# Patient Record
Sex: Female | Born: 1985 | Race: Black or African American | Marital: Single | Smoking: Former smoker
Health system: Northeastern US, Academic
[De-identification: ages and names within clinical notes are randomized; demographics above are authoritative.]

## PROBLEM LIST (undated history)

## (undated) DIAGNOSIS — IMO0002 Reserved for concepts with insufficient information to code with codable children: Secondary | ICD-10-CM

## (undated) DIAGNOSIS — D649 Anemia, unspecified: Secondary | ICD-10-CM

## (undated) DIAGNOSIS — N926 Irregular menstruation, unspecified: Secondary | ICD-10-CM

## (undated) DIAGNOSIS — R87619 Unspecified abnormal cytological findings in specimens from cervix uteri: Secondary | ICD-10-CM

## (undated) HISTORY — PX: PELVIC LAPAROSCOPY: SHX162

## (undated) HISTORY — DX: Anemia, unspecified: D64.9

## (undated) HISTORY — DX: Unspecified abnormal cytological findings in specimens from cervix uteri: R87.619

## (undated) HISTORY — PX: OVARY REMOVAL: SHX86

## (undated) HISTORY — DX: Reserved for concepts with insufficient information to code with codable children: IMO0002

---

## 2007-03-31 HISTORY — PX: ECTOPIC PREGNANCY SURGERY: SHX613

## 2009-06-23 ENCOUNTER — Encounter: Payer: Self-pay | Admitting: Emergency Medicine

## 2009-06-23 ENCOUNTER — Emergency Department
Admission: EM | Admit: 2009-06-23 | Disposition: A | Discharge status: Home / Self Care | Payer: Self-pay | Source: Ambulatory Visit | Attending: Emergency Medicine | Admitting: Emergency Medicine

## 2009-06-23 LAB — TYPE AND SCREEN
ABO RH Blood Type: O POS
Antibody Screen: NEGATIVE

## 2009-06-23 LAB — PLASMA PROF 7 (ED ONLY)
Anion Gap,PL: 12 (ref 7–16)
CO2,Plasma: 23 mmol/L (ref 20–28)
Chloride,Plasma: 104 mmol/L (ref 96–108)
Creatinine: 0.74 mg/dL (ref 0.51–0.95)
GFR,Black: >59 *
GFR,Caucasian: >59 *
Glucose,Plasma: 92 mg/dL (ref 74–106)
Potassium,Plasma: 3.7 mmol/L (ref 3.4–4.7)
Sodium,Plasma: 139 mmol/L (ref 132–146)
UN,Plasma: 9 mg/dL (ref 6–20)

## 2009-06-23 LAB — COMPREHENSIVE METABOLIC PANEL
ALT: 25 U/L (ref 0–35)
AST: 57 U/L — ABNORMAL HIGH (ref 0–35)
Albumin: 4.8 g/dL (ref 3.5–5.2)
Alk Phos: 81 U/L (ref 35–105)
Anion Gap: 9 (ref 7–16)
Bilirubin,Total: 0.2 mg/dL (ref 0.0–1.2)
CO2: 25 mmol/L (ref 20–28)
Calcium: 8.9 mg/dL — ABNORMAL LOW (ref 9.0–10.4)
Chloride: 104 mmol/L (ref 96–108)
Creatinine: 0.75 mg/dL (ref 0.51–0.95)
GFR,Black: >59 *
GFR,Caucasian: >59 *
Glucose: 94 mg/dL (ref 74–106)
Lab: 10 mg/dL (ref 6–20)
Potassium: 3.7 mmol/L (ref 3.3–5.1)
Sodium: 138 mmol/L (ref 133–145)
Total Protein: 7.6 g/dL (ref 6.3–7.7)

## 2009-06-23 LAB — HOLD RED: Hold Red: 1

## 2009-06-23 LAB — ETHANOL: Ethanol: 209 mg/dL

## 2009-06-23 LAB — HOLD GRAY

## 2009-06-23 LAB — POCT URINE PREGNANCY
Exp date: 22912
Lot #: 706935
Preg Test,UR POC: NEGATIVE

## 2009-06-23 LAB — CBC
Hematocrit: 36 % (ref 34–45)
Hemoglobin: 13.2 g/dL (ref 11.2–15.7)
MCV: 78 fL — ABNORMAL LOW (ref 79–95)
Platelets: 254 THOU/uL (ref 160–370)
RBC: 4.7 MIL/uL (ref 3.9–5.2)
RDW: 13.2 % (ref 11.7–14.4)
WBC: 9.3 THOU/uL (ref 4.0–10.0)

## 2009-06-23 LAB — APTT: aPTT: 28.9 s (ref 22.3–35.3)

## 2009-06-23 LAB — PROTIME-INR
INR: 1 (ref 0.9–1.1)
Protime: 13.4 s (ref 11.9–14.7)

## 2009-06-23 LAB — RUQ PANEL (ED ONLY)
Amylase: 78 U/L (ref 28–100)
Bilirubin,Direct: <0.2 mg/dL (ref 0.0–0.3)
Lipase: 23 U/L (ref 13–60)

## 2009-06-23 MED ORDER — HYDROCODONE-ACETAMINOPHEN 5-500 MG PO TABS
1.0000 | ORAL_TABLET | Freq: Once | ORAL | Status: AC
Start: 2009-06-23 — End: 2009-06-23

## 2009-06-23 MED ORDER — HYDROCODONE-ACETAMINOPHEN 5-500 MG PO TABS
ORAL_TABLET | ORAL | Status: AC
Start: 2009-06-23 — End: 2009-06-23
  Filled 2009-06-23: qty 2

## 2009-06-23 MED ORDER — HYDROCODONE-ACETAMINOPHEN 5-500 MG PO TABS
1.0000 | ORAL_TABLET | Freq: Four times a day (QID) | ORAL | Status: AC | PRN
Start: 2009-06-23 — End: 2009-07-03

## 2009-06-23 MED ORDER — MORPHINE SULFATE 4 MG/ML IJ SOLN
4.0000 mg | Freq: Once | INTRAMUSCULAR | Status: DC
Start: 2009-06-23 — End: 2009-06-23
  Filled 2009-06-23: qty 1

## 2009-06-23 MED ORDER — HYDROCODONE-ACETAMINOPHEN 5-500 MG PO TABS
1.0000 | ORAL_TABLET | Freq: Once | ORAL | Status: DC
Start: 2009-06-23 — End: 2009-06-23

## 2009-06-23 MED ORDER — MORPHINE SULFATE 4 MG/ML IJ SOLN
4.0000 mg | Freq: Once | INTRAMUSCULAR | Status: AC
Start: 2009-06-23 — End: 2009-06-23
  Filled 2009-06-23: qty 1

## 2009-06-23 MED ORDER — SODIUM CHLORIDE 0.9 % IV BOLUS *I*
2000.0000 mL | Freq: Once | Status: AC
Start: 2009-06-23 — End: 2009-06-23

## 2009-06-23 MED ORDER — ONDANSETRON HCL 2 MG/ML IV SOLN *I*
4.0000 mg | Freq: Once | INTRAMUSCULAR | Status: AC
Start: 2009-06-23 — End: 2009-06-23
  Filled 2009-06-23: qty 2

## 2009-06-23 MED ORDER — TETANUS-DIPHTHERIA TOXOIDS TD 5-2 LFU IM INJ
0.5000 mL | INJECTION | Freq: Once | INTRAMUSCULAR | Status: DC
Start: 2009-06-23 — End: 2009-06-23

## 2009-06-23 MED ADMIN — Hydrocodone-Acetaminophen Tab 5-500 MG: 1 | ORAL | NDC 00406035762

## 2009-06-23 MED ADMIN — Sodium Chloride IV Soln 0.9%: 2000 mL | INTRAVENOUS | NDC 00338004904

## 2009-06-23 MED ADMIN — Ondansetron HCl Inj 4 MG/2ML (2 MG/ML): 4 mg | INTRAVENOUS | NDC 00409475503

## 2009-06-23 MED ADMIN — Morphine Sulfate Inj 4 MG/ML: 4 mg | INTRAVENOUS | NDC 00409125830
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
## 2009-06-23 NOTE — Progress Notes (Signed)
Social Work provided support to patient's family.  Social Work facilitated communication between physician and family. Social Work assisted family in seeing patient.      [address] [place], [place] [phone]
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
## 2009-06-24 ENCOUNTER — Encounter: Payer: Self-pay | Admitting: Emergency Medicine

## 2009-06-24 ENCOUNTER — Emergency Department
Admission: EM | Admit: 2009-06-24 | Disposition: A | Discharge status: Home / Self Care | Payer: Self-pay | Source: Ambulatory Visit

## 2009-06-24 MED ORDER — CYCLOBENZAPRINE HCL 10 MG PO TABS *I*
10.0000 mg | ORAL_TABLET | Freq: Three times a day (TID) | ORAL | Status: AC | PRN
Start: 2009-06-24 — End: 2009-07-04
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
## 2009-06-24 NOTE — Discharge Instructions (Signed)
Take Vicodin 1-2 tablets every 4-6 hours as needed for pain control.  Do not take Tylenol.  Do not drive while taking.    Take Flexeril 1 tablet every eight hours as needed for spasm and pain control.    Apply heat to the affected area.    Wear neck collar until you follow up with orthopedics.    Follow up with your primary care provider.    Return to the ED with any worsening or concerning signs or symptoms.
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
## 2010-03-28 ENCOUNTER — Ambulatory Visit
Admit: 2010-03-28 | Discharge: 2010-03-28 | Disposition: A | Discharge status: Home / Self Care | Payer: Self-pay | Source: Ambulatory Visit

## 2010-03-28 LAB — CBC AND DIFFERENTIAL
Baso # K/uL: 0 THOU/uL (ref 0.0–0.1)
Basophil %: 0.2 % (ref 0.1–1.2)
Eos # K/uL: 0.1 THOU/uL (ref 0.0–0.4)
Eosinophil %: 0.8 % (ref 0.7–5.8)
Hematocrit: 36 % (ref 34–45)
Hemoglobin: 12.5 g/dL (ref 11.2–15.7)
Lymph # K/uL: 1.9 THOU/uL (ref 1.2–3.7)
Lymphocyte %: 22.1 % (ref 19.3–51.7)
MCV: 80 fL (ref 79–95)
Mono # K/uL: 0.4 THOU/uL (ref 0.2–0.9)
Monocyte %: 5 % (ref 4.7–12.5)
Neut # K/uL: 6.2 THOU/uL — ABNORMAL HIGH (ref 1.6–6.1)
Platelets: 261 THOU/uL (ref 160–370)
RBC: 4.6 MIL/uL (ref 3.9–5.2)
RDW: 13.4 % (ref 11.7–14.4)
Seg Neut %: 71.9 % — ABNORMAL HIGH (ref 34.0–71.1)
WBC: 8.7 THOU/uL (ref 4.0–10.0)

## 2010-03-28 LAB — URINALYSIS WITH REFLEX TO MICROSCOPIC
Ketones, UA: NEGATIVE
Leuk Esterase,UA: NEGATIVE
Nitrite,UA: NEGATIVE
Protein,UA: NEGATIVE mg/dL
Specific Gravity,UA: 1.021 (ref 1.002–1.030)
pH,UA: 5.5 (ref 5.0–8.0)

## 2010-03-28 LAB — URINE MICROSCOPIC (IQ200)
RBC,UA: 2 /HPF (ref 0–2)
WBC,UA: 3 /HPF (ref 0–5)

## 2010-03-28 LAB — COMPREHENSIVE METABOLIC PANEL
ALT: 11 U/L (ref 0–35)
AST: 21 U/L (ref 0–35)
Albumin: 4.4 g/dL (ref 3.5–5.2)
Alk Phos: 53 U/L (ref 35–105)
Anion Gap: 12 (ref 7–16)
Bilirubin,Total: 0.3 mg/dL (ref 0.0–1.2)
CO2: 23 mmol/L (ref 20–28)
Calcium: 9 mg/dL (ref 8.8–10.2)
Chloride: 104 mmol/L (ref 96–108)
Creatinine: 0.68 mg/dL (ref 0.51–0.95)
GFR,Black: >59 *
GFR,Caucasian: >59 *
Glucose: 92 mg/dL (ref 74–106)
Lab: 12 mg/dL (ref 6–20)
Potassium: 4.3 mmol/L (ref 3.3–5.1)
Sodium: 139 mmol/L (ref 133–145)
Total Protein: 7.2 g/dL (ref 6.3–7.7)

## 2010-04-01 LAB — HEPATITIS C QUANTITATIVE/GENOTYPE: HCV PCR: NEGATIVE IU/mL

## 2010-06-09 ENCOUNTER — Emergency Department: Admit: 2010-06-09 | Disposition: A | Discharge status: Home / Self Care | Payer: Self-pay | Source: Ambulatory Visit

## 2010-06-09 ENCOUNTER — Encounter: Payer: Self-pay | Admitting: Medical

## 2010-06-09 HISTORY — DX: Irregular menstruation, unspecified: N92.6

## 2010-06-09 LAB — POCT URINALYSIS DIPSTICK
Glucose,UA: NORMAL
Ketones, UA: NEGATIVE
Lot #: 20836001
Nitrite,UA POCT: NEGATIVE
PH,Ur: 5

## 2010-06-09 LAB — CBC AND DIFFERENTIAL
Baso # K/uL: 0 10*3/uL (ref 0.0–0.1)
Basophil %: 0.4 % (ref 0.1–1.2)
Eos # K/uL: 0.1 10*3/uL (ref 0.0–0.4)
Eosinophil %: 1.5 % (ref 0.7–5.8)
Hematocrit: 36 % (ref 34–45)
Hemoglobin: 12.4 g/dL (ref 11.2–15.7)
Lymph # K/uL: 3 10*3/uL (ref 1.2–3.7)
Lymphocyte %: 39.2 % (ref 19.3–51.7)
MCV: 78 fL — ABNORMAL LOW (ref 79–95)
Mono # K/uL: 0.5 10*3/uL (ref 0.2–0.9)
Monocyte %: 6.6 % (ref 4.7–12.5)
Neut # K/uL: 3.9 10*3/uL (ref 1.6–6.1)
Platelets: 234 10*3/uL (ref 160–370)
RBC: 4.5 MIL/uL (ref 3.9–5.2)
RDW: 14.2 % (ref 11.7–14.4)
Seg Neut %: 52 % (ref 34.0–71.1)
WBC: 7.5 10*3/uL (ref 4.0–10.0)

## 2010-06-09 LAB — URINALYSIS REFLEX TO CULTURE
Nitrite,UA: NEGATIVE
Protein,UA: 25 mg/dL — AB
Specific Gravity,UA: 1.027 (ref 1.001–1.030)
pH,UA: 6.5 (ref 5.0–8.0)

## 2010-06-09 LAB — POCT URINE PREGNANCY: Lot #: 206830

## 2010-06-09 LAB — URINE MICROSCOPIC (IQ200): RBC,UA: >50 /hpf — AB (ref 0–2)

## 2010-06-09 LAB — COMPREHENSIVE METABOLIC PANEL
ALT: 19 U/L (ref 0–35)
AST: 23 U/L (ref 0–35)
Albumin: 4.1 g/dL (ref 3.5–5.2)
Alk Phos: 115 U/L — ABNORMAL HIGH (ref 35–105)
Anion Gap: 11 (ref 7–16)
Bilirubin,Total: 0.1 mg/dL (ref 0.0–1.2)
CO2: 25 mmol/L (ref 20–28)
Calcium: 8.9 mg/dL (ref 8.8–10.2)
Chloride: 102 mmol/L (ref 96–108)
Creatinine: 0.69 mg/dL (ref 0.51–0.95)
GFR,Black: >59 *
GFR,Caucasian: >59 *
Globulin: 2.7 g/dL (ref 2.7–4.3)
Glucose: 99 mg/dL (ref 74–106)
Lab: 8 mg/dL (ref 6–20)
Potassium: 4.2 mmol/L (ref 3.3–5.1)
Sodium: 138 mmol/L (ref 133–145)
Total Protein: 6.8 g/dL (ref 6.3–7.7)

## 2010-06-09 LAB — BLOOD BANK HOLD LAVENDER

## 2010-06-09 LAB — HOLD BLUE

## 2010-06-09 LAB — BHCG SUB-UNIT (~~LOC~~): Beta HCG Sub-Unit: <1 m[IU]/mL (ref 0–1)

## 2010-06-09 MED ORDER — KETOROLAC TROMETHAMINE 30 MG/ML IJ SOLN *I*
30.0000 mg | Freq: Once | INTRAMUSCULAR | Status: AC
Start: 2010-06-09 — End: 2010-06-09

## 2010-06-09 MED ORDER — KETOROLAC TROMETHAMINE 30 MG/ML IJ SOLN *I*
INTRAMUSCULAR | Status: AC
Start: 2010-06-09 — End: 2010-06-09
  Administered 2010-06-09: 30 mg via INTRAVENOUS
  Filled 2010-06-09: qty 1

## 2010-06-09 NOTE — ED Provider Notes (Signed)
 History     Chief Complaint   Patient presents with   . Ectopic Pregnancy     Pt has hx of ectopic pregnancy, states "i have cramps near my butt and that is how I felt when I had my last ectopic pregnancy, thinks she may be starting her menses, not sure if she is currentyl pregnant, g1 p0 a1, does not appear in any distress     HPI Comments: Pt is a 25 yo female who c/o abdominal cramping. States she initially was concerned that she was having an ectopic pregnancy as she has had one before, then states in the waiting room she began with vaginal bleeding. Wasn't even sure if she was pregnant, hadn't taken a test. States her menses are very irregular so it is not unsual for her to go a few months in between. States the cramping has been her entire abdomen, for the last 1-2 days. No nausea, vomiting, back pain, fever, chills. Still eating and drinking normally. Is sexually active without protection.     The history is provided by the patient. No language interpreter was used.       Past Medical History   Diagnosis Date   . Irregular periods/menstrual cycles        History reviewed. No pertinent past surgical history.    No family history on file.     reports that she has never smoked. She does not have any smokeless tobacco history on file. She reports that she drinks alcohol. She reports that she does not use illicit drugs.    Review of Systems   Review of Systems   Constitutional: Negative for fever, chills, diaphoresis, activity change, appetite change and fatigue.   Respiratory: Negative for shortness of breath.    Cardiovascular: Negative for chest pain.   Gastrointestinal: Positive for abdominal pain. Negative for nausea, vomiting and diarrhea.   Genitourinary: Positive for vaginal bleeding and menstrual problem. Negative for dysuria, hematuria, vaginal discharge and vaginal pain.   Musculoskeletal: Negative for myalgias, back pain and arthralgias.   Skin: Negative for color change, pallor, rash and wound.    Neurological: Negative for dizziness, weakness and numbness.   Psychiatric/Behavioral: Negative for confusion. The patient is not nervous/anxious.        Physical Exam   BP 130/73  Pulse 70  Temp(Src) 36.6 C (97.9 F) (Temporal)  Resp 20  Wt 81.647 kg (180 lb)  SpO2 100%    Physical Exam   Constitutional: She is oriented to person, place, and time. She appears well-developed and well-nourished. No distress.   HENT:   Head: Normocephalic and atraumatic.   Eyes: Conjunctivae and EOM are normal. Pupils are equal, round, and reactive to light.   Neck: Normal range of motion. Neck supple.   Cardiovascular: Normal rate, regular rhythm, normal heart sounds and intact distal pulses.  Exam reveals no gallop and no friction rub.    No murmur heard.  Pulmonary/Chest: Effort normal and breath sounds normal. No respiratory distress. She has no wheezes. She has no rales. She exhibits no tenderness.   Abdominal: Soft. Bowel sounds are normal. She exhibits no distension and no mass. There is no tenderness. There is no rebound and no guarding.   Musculoskeletal: Normal range of motion. She exhibits no edema and no tenderness.        No CVA tenderness.   Neurological: She is alert and oriented to person, place, and time.   Skin: Skin is warm and dry. No rash  noted. No erythema. No pallor.   Psychiatric: She has a normal mood and affect. Her behavior is normal.       Medical Decision Making   MDM  Number of Diagnoses or Management Options  Menses painful:   Diagnosis management comments: Patient seen by me today, 06/09/2010 at the time of arrival 2:55 PM    Assessment:  25 y.o., female comes to the ED with abdominal cramping, vaginal bleeding  Differential Diagnosis includes pregnancy, ectopic preg/ruptured, dysmenorrhea  Plan: labs, urine preg, urinalysis            Ailene Rud, PA

## 2010-06-09 NOTE — Discharge Instructions (Signed)
You were seen in the Emergency Department for menstrual cramping, pregnancy test     Instructions: Take Ibuprofen as needed for pain. Follow up with your primary doctor/GYN.      Preliminary Test Results: laboratory studies showed no acute abnormality    Summary of Procedures: None    Return to the Emergency Department if your symptoms persist or worsen.  Please return if you have any concerns and are unable contact your PCP.

## 2010-06-09 NOTE — ED Notes (Signed)
 Pt resting awaiting lab results and PA bedside eval.

## 2010-06-11 LAB — AEROBIC CULTURE

## 2010-08-12 ENCOUNTER — Encounter: Payer: Self-pay | Admitting: Obstetrics and Gynecology

## 2010-08-26 ENCOUNTER — Ambulatory Visit: Payer: Self-pay | Admitting: Obstetrics and Gynecology

## 2010-12-10 ENCOUNTER — Ambulatory Visit: Payer: Self-pay | Admitting: Obstetrics and Gynecology

## 2011-01-14 ENCOUNTER — Ambulatory Visit: Payer: Self-pay | Admitting: Obstetrics and Gynecology

## 2011-01-14 ENCOUNTER — Encounter: Payer: Self-pay | Admitting: Obstetrics and Gynecology

## 2011-01-14 VITALS — BP 144/84 | Ht 63.0 in | Wt 189.0 lb

## 2011-01-14 DIAGNOSIS — Z8759 Personal history of other complications of pregnancy, childbirth and the puerperium: Secondary | ICD-10-CM

## 2011-01-14 DIAGNOSIS — N926 Irregular menstruation, unspecified: Secondary | ICD-10-CM

## 2011-01-14 LAB — POCT URINE PREGNANCY: Lot #: 708187

## 2011-01-14 NOTE — Progress Notes (Signed)
Pt with 2 prior ectopic pregnancies.  Presents to discuss etiology of ectopic and fertility.  Pt desire fertility.  Pt with h/o Gon/chlam infection x 1.  First ectopic required an RSO and occurred in 2009.  It had ruptured.  The second ectopic occurred in 06/2010 and was treated with MTX.  This occurred at [facility]  Pt consented to care everyone.  Meds records reviewed.  [place] 07/27/10 - GS with YS noted in Left adnexa with no IUP noted.  Pt responded well to MTX.  Pt is up to date with PAP (done through [facility]).    Past Medical History   Diagnosis Date   . Irregular periods/menstrual cycles    . Anemia    . Abnormal Pap smear      Past Surgical History   Procedure Date   . Pelvic laparoscopy      10/2007 - RSO   . Ovary removal      07/2007 with ectopic     BP 144/84  Ht 5\' 3"  (1.6 m)  Wt 189 lb (85.73 kg)  BMI 33.48 kg/m2  LMP 12/15/2010  Gen-NAD    Medical/surgical history reviewed.  D/W pt my concerns re: h/o pelvic infections and h/o ectopic x2.  Pt is at high risk for repeat ectopic.  D/W pt HSG testing.  Advised that this may not be covered by insurance.  D/W pt options for pregnancy if her Left tube is blocked or severely damaged.  Pt would like to proceed with an HSG    UPT today neg.    A/P-25 y.o. With h/o ectopic x 2  Encouraged smoking cessation  Schedule HSG, RTC after her HSG.

## 2011-01-14 NOTE — Progress Notes (Signed)
Therapist checked in with pt to assess current functioning. Pt endorsed having trouble falling asleep nearly everyday and having little interest or pleasure in doing things and feeling tired several days. Pt's score on PHQ-9 was a 5. No mental health concerns indicated that this time. Therapist let pt know if she was interested in services in the future a behavioral health referral could be made.

## 2011-01-14 NOTE — Patient Instructions (Signed)
HYSTEROSALPINGOGRAM  What is a hysterosalpingogram (HSG)?    A hysterosalpingogram or HSG is an x-ray procedure performed to determine whether the fallopian tubes are open and to see if the shape of the uterine cavity is normal.  An HSG is a procedure that is typically done in our office and takes less than one half hour to perform.  It is usually done after menses have ended, but before ovulation occurs (generally between cycle days 6-12).  How is the HSG done?  You will be positioned under a fluoroscope (a real-time x-ray imager) on a table.  A speculum exam will be performed to visualize the cervix.  A small catheter is used to introduce contrast media (dye) through the cervical canal.  The contrast fills the uterus, then enters the tubes, outlines the length of the tubes, and spills out their ends if they are open.  Any abnormalities in the uterine cavity or fallopian tubes will be visible on a monitor.  Following the completion of the test, you can return to your normal routine.  You may experience spotting and/or mild cramping for 1-2 days.  You should avoid intercourse or douching for the next two days.  If you develop any fever, chills, severe abdominal pain or heavy vaginal bleeding, you should contact the physician immediately.  While you are welcome to have someone drive you to your appointment, it is not a requirement.  Any accompanying individuals will not be allowed in the procedure room during your HSG.  What are the risks and complications of an HSG?    • Discomfort.  An HSG can cause mild or moderate uterine cramping for about 5 minutes, however some women may experience cramps for several hours.  We recommend taking ibuprofen (Motrin®, Advil®) one hour prior to the test to help minimize discomfort.  • Infection.  Infection may happen, although this is rare, occurring less than 2% of the time.  Signs of an infection are lower abdominal pain and fever that develop within a few days following the  procedure. You should contact the physician if you develop these signs.  If an infection develops, hospitalization with IV antibiotics or surgery may be necessary.  A consequence of this infection may be scarred fallopian tubes and infertility.  Infections are more likely to occur in women who have already had a previous pelvic infection and/or previously damaged tubes.  Antibiotics may be prescribed after the HSG if tubal abnormalities are discovered.  • Allergic reaction.  Rarely, a patient may have an allergy to the iodine contrast used in an HSG.  You should alert the physician, prior to your HSG, if you have a history of a previous allergic reaction to iodine, intravenous contrast dyes or seafood.  If this reaction was severe, we will probably not recommend an HSG.  A saline sonohysterogram may be performed instead of the HSG, although this test will provide information only about your uterine cavity, it does not provide information about your fallopian tubes.  If you experience a rash, itching, or swelling after the procedure  contact our office.  • Exposure of potential pregnancy.  Despite your perception of a normal menstrual period, there is always the possibility of a potential pregnancy.  For this reason, a blood pregnancy test is required for all patients prior to the HSG.  Instructions prior to the HSG.  • Please call our office at [phone] on day 1-3 of your menstrual cycle to schedule your HSG between cycle days 6-12.    The HSG is performed in our office, Monday through Friday.  • A  blood pregnancy test is required and can be done from the onset of your menses to the day before your HSG.  If you are doing an HSG and a treatment cycle in the same month, it is most convenient to have this done at the time of your baseline ultrasound.  If you are not doing a treatment cycle, then you will need to go to a lab to have this bloodwork done.  When you schedule your HSG, a lab slip will be faxed to a lab  of your choice to have your pregnancy test drawn prior to your procedure.  We will only call you if the pregnancy test is positive.  • You may take Ibuprofen 600mg approximately one hour prior to your HSG.    [email or web address].docx Rev 2/12

## 2011-01-16 ENCOUNTER — Telehealth: Payer: Self-pay

## 2011-01-16 NOTE — Telephone Encounter (Signed)
Pam from Radiology called to notify us that [employer] does not cover the HSG.  This possibility was discussed with the patient @ the time it was ordered.

## 2011-01-21 ENCOUNTER — Ambulatory Visit
Admit: 2011-01-21 | Discharge: 2011-01-21 | Disposition: A | Discharge status: Home / Self Care | Payer: Self-pay | Source: Ambulatory Visit | Attending: Obstetrics and Gynecology | Admitting: Obstetrics and Gynecology

## 2011-01-27 ENCOUNTER — Telehealth: Payer: Self-pay

## 2011-01-27 NOTE — Telephone Encounter (Signed)
Message copied by Talmadge Coventry on Tue Jan 27, 2011 12:00 PM  ------       Message from: Lona Millard       Created: Mon Jan 26, 2011  2:04 PM         Please call and inform pt, her HSG did show that the tube was open which is great news!  Recc continue 'trying for pregnancy' with regular intercourse and keep appt with me in Jan.

## 2011-01-27 NOTE — Telephone Encounter (Signed)
Line busy, will allow time and retry.

## 2011-04-16 ENCOUNTER — Ambulatory Visit: Payer: Self-pay | Admitting: Obstetrics and Gynecology

## 2011-04-20 ENCOUNTER — Ambulatory Visit: Payer: Self-pay | Admitting: Obstetrics and Gynecology

## 2011-04-20 ENCOUNTER — Encounter: Payer: Self-pay | Admitting: Obstetrics and Gynecology

## 2011-04-20 VITALS — BP 138/81 | Ht 63.0 in | Wt 193.0 lb

## 2011-04-20 DIAGNOSIS — N926 Irregular menstruation, unspecified: Secondary | ICD-10-CM

## 2011-07-22 ENCOUNTER — Ambulatory Visit: Payer: Self-pay | Admitting: Obstetrics and Gynecology

## 2011-07-29 ENCOUNTER — Ambulatory Visit: Payer: Self-pay | Admitting: Obstetrics and Gynecology

## 2011-09-18 ENCOUNTER — Ambulatory Visit: Payer: Self-pay | Admitting: Obstetrics and Gynecology

## 2011-09-18 ENCOUNTER — Encounter: Payer: Self-pay | Admitting: Obstetrics and Gynecology

## 2011-09-18 VITALS — BP 111/82 | Ht 63.0 in | Wt 193.0 lb

## 2011-09-18 NOTE — Patient Instructions (Addendum)
Go to Lab on day #3 of menstrual cycle for blood work!          Clomiphene Citrate      Generic Name                                                              Brand Name  clomiphene citrate                                                             Clomid,  Serophene     How it works:  Clomiphene citrate comes in a pill, which a woman will begin taking usually on the fifth day of her menstrual cycle through the ninth day. The typical starting dose is one 50mg  tablet per day for five days. If a cycle is unsuccessful, your physician may increase the dose by 50mg  increments in subsequent cycles.    Clomiphene stimulates the release of hormones needed to cause ovulation.     Clomiphene citrate sends a signal that the level of estrogen is low (even if it is not in actuality, low). As a result, the hypothalamus in the brain sends a signal to the pituitary gland to release more FSH and luteinizing hormone (LH) into the bloodstream. The high level of FSH, in turn, stimulates the development of a follicle and egg. A surge of LH may occur about a week after the last tablet is taken. This surge of LH causes the egg to be released in a process called ovulation. Many patients take ovidrel to trigger ovulation instead of waiting for an [facility] surge.  In some cases, the treatment may result in more than one egg. If ovulation does occur, fertilizing the released egg, either through timed intercourse or intrauterine insemination, is the next step.     Monitoring:  Call with the first day of flow to schedule a baseline ultrasound usually between the 1st through the 5th day of your period. Some patients need to come in by cycle day 3 for labs. If there is a cyst at the baseline u/s your cycle may be deferred until next month. These are usually functional cysts and not a cause for alarm.     Your scripts will be sent to the pharmacy at your baseline u/s.    Midcycle ultrasounds are done around cycle day 12-14 to determine your response  to the medication and how many follicles are developing and if they are mature.  A mature follicle measures about 18-20 mm in size, although occasionally smaller follicles will also ovulate. We will also check blood levels of estradiol and LH to give further information about the cycle.    Once a mature follicle is seen, you may be instructed to take ovidrel and schedule an IUI or have intercourse. Most patients are instructed to have intercourse the evening of ovidrel also, but this may vary by patient situation.    Side effects:  *Multiple gestation- Clomid is associated with a slightly increased risk (8-10%) of        multiple gestation pregnancy (more than one baby during  pregnancy, i.e., twins,   and rarely triplets or more).   *Ovarian Enlargement: About 15% of patients will notice ovarian cyst formation, possibly    accompanied by abdominal discomfort and/or bloating. These cysts usually   regress without treatment. There may be a mild mid cycle abdominal pain at the   time of ovulation, which is normal.   *Hot Flashes: Approximately 10% of patients will have vasomotor symptoms known as    hot flashes, a temporary feeling of facial flushing, or tingling/numbness in    extremities.    These are self- limiting.   *Decreased Cervical Mucus: Approximately 20-25% of patients will experience a    decrease in the amount of cervical mucus made, or a thin endometrial lining.   *Mood Changes: some patients feel irritable ( 1%)    Uncommon Clomid Side Effects:   *  Nausea or Vomiting (2%)   *  Breast Tenderness (2%)   *  Visual Symptoms Approximately 1% of patients will experience blurred or      spotted vision. If this occurs, discontinue Clomiphene use immediately and      contact the office.   *  Headaches (1%)     Ovarian Cancer Risk: Some studies have suggested that the use of fertility medications may increase the risk of ovarian cancer. This finding has been refuted in other studies, therefore, the true risk, if  any, is unknown at this time.       Clomid Instructions                                                WHY USE IT?  Clomid helps your ovaries to release eggs (ovulate).    HOW TO USE IT?  Clomid is taken as a pill usually on days 5, 6, 7, 8, & 9 of your cycle. Day 1 is the first day of your period. The dose or duration may be changed to achieve ovulation. Provera (progesterone) may first be used to bring on a period for some patients.    The day of ovulation on Clomid is usually between cycle day 14 and 17. Having sexual intercourse at least every other day between cycle day 13 and 18 will improve your chances of becoming pregnant during the Clomid cycle. You may monitor your ovulation using basal body temperature charts or with ovulation kits. If using the ovulation predictor kits, having intercourse the day of the surge and the two days following is recommended.    If you get your period, call when it starts for an appointment with your doctor, so that an exam may be done, and another clomid cycle can be considered if appropriate.    If you do not get a period by day 35 of the cycle, please get a blood pregnancy test. If it is negative, speak to your doctor for instructions to bring on another period and to plan a follow?up appointment.    THINGS TO KNOW:  If you get pregnant while using Clomid, your chance of twins is 7%, and triplets is less than 1%. Some studies have suggested the use of "fertility drugs" may increase your risk for ovarian cancers in the future. It is unclear if these drugs increase the risk, or people who have problems with fertility are prone for these cancers. If there is an actual risk, it is very  low. If you have a history of liver problems or ovarian cancer, it may be wise to avoid this medication.    SIDE EFFECTS:   The most common side effect is hot flashes (20%).   Breast tenderness, headaches, nausea, bloating may also occur at different times.   Less than 3/1,000 people have  dryness or loss of hair.   Persistent ovarian cysts may form from the use of this medication.   Ovarian hyperstimulation syndrome is a rare side effect at low doses.   Visual changes like flashes of light or blurring are very rare; if they occur, call our office as you will likely need to stop the medication.          Reviewed 12/2009                                                  Clomid info sheet.doc rev 2/12

## 2011-09-18 NOTE — Progress Notes (Signed)
Pt kept menstrual log since January and brought in today for review.  Cycles vary from 26-39 days.  Pt reports her boyfriend does have 5 children from a prior relationship.  She has had 2 ectopics and her right tube was removed.  An HSG was done showing a patent left tube.  ROS otherwise neg.    Past Medical History   Diagnosis Date   . Irregular periods/menstrual cycles    . Anemia    . Abnormal Pap smear      Past Surgical History   Procedure Laterality Date   . Pelvic laparoscopy       10/2007 - [facility]   . Ovary removal       07/2007 with ectopic     BP 111/82  Ht 5\' 3"  (1.6 m)  Wt 193 lb (87.544 kg)  BMI 34.2 kg/m2  LMP 08/24/2011  Breastfeeding? No  Gen-NAD    A/P-25 y.o. With irregular menses, desires pregnancy  Will get Day #3 labs, FSH, LH, Prolactin, TSH, estradiol  Given info re: Clomid  RTC 3-4 wks to review labs and make plan for next step

## 2011-09-29 ENCOUNTER — Ambulatory Visit
Admit: 2011-09-29 | Discharge: 2011-09-29 | Disposition: A | Discharge status: Home / Self Care | Payer: Self-pay | Source: Ambulatory Visit | Attending: Obstetrics and Gynecology | Admitting: Obstetrics and Gynecology

## 2011-09-29 DIAGNOSIS — N926 Irregular menstruation, unspecified: Secondary | ICD-10-CM

## 2011-09-29 LAB — TSH: TSH: 0.83 u[IU]/mL (ref 0.27–4.20)

## 2011-09-29 LAB — ESTRADIOL: Estradiol: 28 pg/mL

## 2011-09-29 LAB — LUTEINIZING HORMONE: LH: 5.6 m[IU]/mL

## 2011-09-29 LAB — PROLACTIN: Prolactin: 17.7 ng/mL

## 2011-09-29 LAB — FOLLICLE STIMULATING HORMONE: FSH: 7.1 m[IU]/mL

## 2011-10-08 ENCOUNTER — Encounter: Payer: Self-pay | Admitting: Obstetrics and Gynecology

## 2011-10-08 ENCOUNTER — Ambulatory Visit: Payer: Self-pay | Admitting: Obstetrics and Gynecology

## 2011-10-08 VITALS — Ht 63.0 in | Wt 199.0 lb

## 2011-10-08 DIAGNOSIS — N926 Irregular menstruation, unspecified: Secondary | ICD-10-CM

## 2011-10-08 DIAGNOSIS — N979 Female infertility, unspecified: Secondary | ICD-10-CM

## 2011-10-08 MED ORDER — CLOMIPHENE CITRATE 50 MG PO TABS *I*
50.0000 mg | ORAL_TABLET | Freq: Every day | ORAL | Status: AC
Start: 2011-10-08 — End: 2011-10-13

## 2011-10-08 MED ORDER — CLASSIC PRENATAL 28-0.8 MG PO TABS *I*
1.0000 | ORAL_TABLET | Freq: Every day | ORAL | Status: AC
Start: 2011-10-08 — End: 2012-04-05

## 2011-10-08 NOTE — Patient Instructions (Addendum)
Clomid Instructions                                                WHY USE IT?  Clomid helps your ovaries to release eggs (ovulate).    HOW TO USE IT?  Clomid is taken as a pill usually on days 5, 6, 7, 8, & 9 of your cycle. Day 1 is the first day of your period. The dose or duration may be changed to achieve ovulation. Provera (progesterone) may first be used to bring on a period for some patients.    The day of ovulation on Clomid is usually between cycle day 14 and 17. Having sexual intercourse at least every other day between cycle day 13 and 18 will improve your chances of becoming pregnant during the Clomid cycle. You may monitor your ovulation using basal body temperature charts or with ovulation kits. If using the ovulation predictor kits, having intercourse the day of the surge and the two days following is recommended.    If you get your period, call when it starts for an appointment with your doctor, so that an exam may be done, and another clomid cycle can be considered if appropriate.    If you do not get a period by day 35 of the cycle, please get a blood pregnancy test. If it is negative, speak to your doctor for instructions to bring on another period and to plan a follow?up appointment.    THINGS TO KNOW:  If you get pregnant while using Clomid, your chance of twins is 7%, and triplets is less than 1%. Some studies have suggested the use of "fertility drugs" may increase your risk for ovarian cancers in the future. It is unclear if these drugs increase the risk, or people who have problems with fertility are prone for these cancers. If there is an actual risk, it is very low. If you have a history of liver problems or ovarian cancer, it may be wise to avoid this medication.    SIDE EFFECTS:  · The most common side effect is hot flashes (20%).  · Breast tenderness, headaches, nausea, bloating may also occur at different times.  · Less than 3/1,000 people have dryness or loss of hair.  · Persistent  ovarian cysts may form from the use of this medication.  · Ovarian hyperstimulation syndrome is a rare side effect at low doses.  · Visual changes like flashes of light or blurring are very rare; if they occur, call our office as you will likely need to stop the medication.          Reviewed 12/2009

## 2011-10-09 NOTE — Progress Notes (Signed)
Pt presents today to review labs and discuss next time for trying for pregnancy.  Labs reviewed with patient.  All questions answered.    Results for JLEE, NYDEGGER (MRN [id number]) as of 10/09/2011 13:42   Ref. Range 09/29/2011 14:00   Prolactin No range found 17.7   TSH Latest Range: 0.27-4.20 uIU/mL 0.83   LH No range found 5.6   FSH No range found 7.1   Estradiol No range found 28       Past Medical History   Diagnosis Date   . Irregular periods/menstrual cycles    . Anemia    . Abnormal Pap smear      Past Surgical History   Procedure Laterality Date   . Pelvic laparoscopy       10/2007 - [facility]   . Ovary removal       07/2007 with ectopic     Ht 5\' 3"  (1.6 m)  Wt 199 lb (90.266 kg)  BMI 35.26 kg/m2  LMP 08/24/2011  Breastfeeding? 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    Reviewed risks/benefits of clomid    Will start Clomid 50 mg, will do three cycles, d/w pt use of ovulation predictor kits  RTC 3 months or when pregnant  Start prenatal vitamins

## 2012-01-20 ENCOUNTER — Ambulatory Visit: Payer: Self-pay | Admitting: Obstetrics and Gynecology

## 2012-01-27 ENCOUNTER — Ambulatory Visit: Payer: Self-pay | Admitting: Obstetrics and Gynecology

## 2012-06-20 ENCOUNTER — Telehealth: Payer: Self-pay

## 2012-06-20 NOTE — Telephone Encounter (Signed)
FYI: Patient had a change in her work schedule, she stated that she can only do a morning appointment with the change. The patient was scheduled to see Dr. Leotis Pain 06/24/12 @ 3:00pm  for a FERTILITY FOLLOW UP. I checked Dr. Ewell Poe scheduled, her 1st available morning appointment would have been 07/25/12 but the patient stated that she did not want to wait that long to be see. I scheduled her with Dr. Jerilynn Birkenhead (same team - [facility]) 06/28/12 @ 10:45am, thank you.

## 2012-06-24 ENCOUNTER — Ambulatory Visit: Payer: Self-pay | Admitting: Obstetrics and Gynecology

## 2012-06-28 ENCOUNTER — Ambulatory Visit: Payer: Self-pay | Admitting: Obstetrics & Gynecology

## 2012-08-09 ENCOUNTER — Encounter: Payer: Self-pay | Admitting: Obstetrics and Gynecology

## 2012-08-09 ENCOUNTER — Ambulatory Visit: Payer: Self-pay | Admitting: Obstetrics and Gynecology

## 2012-08-09 VITALS — BP 149/81 | Ht 63.0 in | Wt 211.0 lb

## 2012-08-09 DIAGNOSIS — N760 Acute vaginitis: Secondary | ICD-10-CM

## 2012-08-09 DIAGNOSIS — B9689 Other specified bacterial agents as the cause of diseases classified elsewhere: Secondary | ICD-10-CM

## 2012-08-09 LAB — POCT VAGINAL WET MOUNT
CLUE CELLS,POC: POSITIVE
KOH FOR FUNGUS: NEGATIVE
TRICHOMONAS, POC: NEGATIVE
WHIFF TEST: POSITIVE

## 2012-08-09 MED ORDER — METRONIDAZOLE 0.75 % VA GEL *I*
1.0000 | Freq: Every evening | VAGINAL | Status: DC
Start: 2012-08-09 — End: 2012-08-10

## 2012-08-09 NOTE — Patient Instructions (Signed)
Bacterial Vaginosis    The Basics   Written by the doctors and editors at UpToDate   What is bacterial vaginosis? -- Bacterial vaginosis is an infection in the vagina that can cause bad-smelling vaginal discharge. “Vaginal discharge” is the term doctors and nurses use to describe any fluid that comes out of the vagina (figure 1). Normally, women have a small amount of vaginal discharge each day. But women with bacterial vaginosis can have a lot of vaginal discharge, or vaginal discharge that smells bad.  Bacterial vaginosis is caused by certain bacteria (germs). The vagina normally has different types of bacteria in it. When the amounts or the types of bacteria change, an infection can happen.   Women do not catch bacterial vaginosis from having sex. But women who have bacterial vaginosis have a higher chance of catching other infections from their partner during sex.  What are the symptoms of bacterial vaginosis? -- Most women with bacterial vaginosis have no symptoms. When women have symptoms, they often have a “fishy-smelling” vaginal discharge that they might notice more after sex. The discharge is watery and off-white or gray.   Some women can also have other symptoms that are not as common, but can include:  · Bleeding from the vagina after sex  · Itching on the outside of the vagina  · Pain when urinating or having sex  All of these symptoms can also be caused by other conditions. But if you have these symptoms, let your doctor or nurse know.  Is there a test for bacterial vaginosis? -- Yes. Your doctor or nurse will do an exam. He or she will also take a sample of your vaginal discharge, and do lab tests on the sample to look for an infection.  How is bacterial vaginosis treated? -- Bacterial vaginosis is treated with medicine. Two different medicines can be used. They are called:  · Metronidazole  · Clindamycin  Both of these medicines come in different forms. They can come as a pill or as a gel or  cream that a woman puts inside her vagina. Most women have fewer side effects when they use the gel or cream treatment. But you and your doctor or nurse will decide which medicine and which form is right for you.   It is important that you take all of the medicine your doctor or nurse prescribes, even if your symptoms go away after a few doses. Taking all of your medicine can help prevent the symptoms from coming back.  Does my sex partner need to be treated if I have bacterial vaginosis? -- No. Your sex partner does not need to be treated if you have bacterial vaginosis.  What happens if my symptoms come back? -- If your symptoms come back, let your doctor or nurse know. You might need treatment with more medicine.   Some women get bacterial vaginosis over and over again. These women might take medicine for 3 to 6 months to try to prevent future infections.  What if I am pregnant and have symptoms of bacterial vaginosis? -- If you are pregnant and have symptoms of bacterial vaginosis, tell your doctor or nurse. You might need treatment with medicine.   Can bacterial vaginosis be prevented? -- Sometimes. You can help prevent bacterial vaginosis by:  · Not douching (douching is when a woman puts a liquid inside her vagina to rinse it out)  · Not having a lot of sex partners  · Not smoking  All topics are updated as new evidence becomes available and our peer review process is complete.  This topic   retrieved from UpToDate on: Apr 08, 2011.  Topic 15592 Version 2.0  Release: 20.12 - C21.3  © 2013 [employer], Inc. All rights reserved.  figure 1: Adult female external genitalia     This drawing shows the parts of a woman's genitals.  Graphic 53704 Version 7.0  Disclaimer   The content on the UpToDate website is not intended nor recommended as a substitute for medical advice, diagnosis, or treatment. Always seek the advice of your own physician or other qualified health care professional regarding any medical questions or  conditions.The use of UpToDate content is governed by the UpToDate Terms of Use ©2013 [employer]Date, Inc. All rights reserved.  Copyright   © 2013 [employer]Date, Inc. All rights reserved.

## 2012-08-09 NOTE — Progress Notes (Signed)
Subjective:      Darlene Villegas is a 27 y.o. female who presents for evaluation of thick, white, malodorous vaginal discharge for past week. Sexual history reviewed with the patient. STI Exposure: denies knowledge of risky exposure. Previous history of STI chlamydia. Current symptoms vaginal discharge: white, thick and malodorous, vaginal irritation: mild.  Contraception: none.  Using scented soap for bathing.  No douching but does occasionally wash inside vagina with water only.  Menstrual History:  OB History    Grav Para Term Preterm Abortions TAB SAB Ect Mult Living    2    2   2   0        Patient's last menstrual period was 07/17/2012.       Patient's medications, allergies, past medical, surgical, social and family histories were reviewed and updated as appropriate.    Review of Systems  Pertinent items are noted in HPI.      Objective:      BP 149/81  Ht 1.6 m (5\' 3" )  Wt 95.709 kg (211 lb)  BMI 37.39 kg/m2  LMP 07/17/2012  General:   alert, appears stated age and oriented x3   Lymph Nodes:   no inguinal adenopathy   Pelvis:  External genitalia: normal general appearance  Vaginal: normal rugae and discharge, large amount yellowish-white discharge  Cervix: normal appearance  Adnexa: non palpable  Uterus: normal single, nontender   Cultures:  GC and Chlamydia genprobes      Recent Results (from the past 24 hour(s))   POCT VAGINAL WET MOUNT    Collection Time    08/09/12  8:01 PM       Result Value Range    TRICHOMONAS, POC Negative-none seen      YEAST,POC Test Not Performed      YEAST COMMENT,POC        CLUE CELLS,POC Positive moderate      LEUKOCYTES,POC        KOH FOR FUNGUS Negative-none seen      KOH FOR FUNGUS COMMENT        WHIFF TEST Positive          Assessment:       Bacterial vaginosis        Plan:      See orders for STD cultures and assays  Will call pt with results if abnormal  RX:  Metrogel vaginal; 1 applicator qhs x5d; recalls side effects from prior use  Safe sex practices  reviewed  Vaginitis prevention measures reviewed  RTC prn

## 2012-08-10 ENCOUNTER — Telehealth: Payer: Self-pay

## 2012-08-10 DIAGNOSIS — B9689 Other specified bacterial agents as the cause of diseases classified elsewhere: Secondary | ICD-10-CM

## 2012-08-10 LAB — N. GONORRHOEAE DNA AMPLIFICATION: N. gonorrhoeae DNA Amplification: 0

## 2012-08-10 LAB — CHLAMYDIA PLASMID DNA AMPLIFICATION: Chlamydia Plasmid DNA Amplification: 0

## 2012-08-10 MED ORDER — METRONIDAZOLE 0.75 % VA GEL *I*
1.0000 | Freq: Every evening | VAGINAL | Status: AC
Start: 2012-08-10 — End: 2012-08-15

## 2012-08-10 NOTE — Telephone Encounter (Signed)
Med was resent.  Darlene Villegas

## 2012-08-10 NOTE — Telephone Encounter (Signed)
Patient called and said her medication, metroNIDAZOLE (METROGEL-VAGINAL) 0.75 % vaginal gel,  was sent to the wrong pharmacy. Please send ASAP to new pharmacy [facility] - [place], [place] - [address]

## 2012-12-13 ENCOUNTER — Encounter: Payer: Self-pay | Admitting: Dentist

## 2012-12-13 ENCOUNTER — Ambulatory Visit: Payer: Self-pay | Admitting: Oral and Maxillofacial Surgery

## 2012-12-13 DIAGNOSIS — K029 Dental caries, unspecified: Secondary | ICD-10-CM | POA: Insufficient documentation

## 2012-12-13 MED ORDER — OXYCODONE-ACETAMINOPHEN 5-325 MG PO TABS *I*
1.0000 | ORAL_TABLET | Freq: Four times a day (QID) | ORAL | Status: DC | PRN
Start: 2012-12-13 — End: 2012-12-28

## 2012-12-13 MED ORDER — IBUPROFEN 600 MG PO TABS *I*
600.0000 mg | ORAL_TABLET | Freq: Four times a day (QID) | ORAL | Status: DC | PRN
Start: 2012-12-13 — End: 2012-12-28

## 2012-12-13 MED ORDER — CHLORHEXIDINE GLUCONATE 0.12 % MT SOLN *I*
15.0000 mL | Freq: Two times a day (BID) | OROMUCOSAL | Status: AC
Start: 2012-12-13 — End: 2012-12-27

## 2012-12-13 NOTE — Patient Instructions (Addendum)
ORAL & MAXILLOFACIAL SURGERY    POSTOPERATIVE INSTRUCTIONS FOLLOWING DENTAL SURGERY    First 30 minutes after procedure:  Bite on gauze for 30 minutes after your procedure, to help stop bleeding.  It is best that you do this without checking the site until the 30 minutes is up. Do not chew on the gauze, as continued pressure is desired.  During this time, do not speak or do other activities that could dislodge the gauze packs.  If bleeding continues after 30 minutes, bite on fresh gauze as follows:  dampen the gauze first so that it is moist (not wet) and fold it twice.  Place the dampened gauze on the extraction sites for another 30 minutes.  Repeat as necessary.  A small amount of continued blood, which tinges the saliva, is expected during the first 24 hours and does not require further pressure.  If bleeding restarts after it has stopped, use gauze pressure again or consider gently biting on a dry tea bag placed over the extraction site for 30 minutes.  If the bleeding does not stop despite repeated attempts as above, please call [phone].    Avoid excessive exertion for 72 hours after your procedure.  No bending, lifting, strenuous activity,sports or gym-class.  After this time, resume normal activity gradually as you become comfortable.    Mouth opening may be limited during the first week to ten days after surgery.  This is expected due to swelling and tightness of the jaw muscles.   Bruising (black and blue areas) may develop after surgery.  Should this occur, expect bruising to resolve after the first week to ten days.   5.   Earache or TMJ (jaw joint) tenderness may develop that will gradually resolve over the first week.          6.   NO ORAL CARE during the first 24 hours.  Beginning on the day after surgery, rinse your mouth after meals (or    4-5 times per day) with warm salt water (1/2 teaspoon of salt in an 8oz. glass of warm water).  A prescription mouth rinse may be given in selected cases. Begin  tooth brushing on the day after surgery, as you normally  would however this should be performed gently in the area of extractions.         7.  DO NOT USE STRAWS for the first week after surgery.  Straws create suction pressure inside your mouth and could dislodge the blood clots that normally form in the extraction sites.          8.  DO NOT SMOKE during the first 21 days after surgery.  Smoking greatly increases the risk of  and other healing complications including infection, pain, and dry socket.          9.  SWELLING is normal and will maximize 48 - 72 hours after surgery.  Apply ice in a damp towel against your cheeks for the first 24-48 hours, as follows: 20 minutes on 40 minutes off, then repeat. Keep your head elevated with 2 or 3 pillows when lying down during the first 24 hours.  This will reduce bleeding, swelling and improve your level of comfort.  May apply VaselineR or ChapstickR to the lips to keep them moist.        10.  Mild to moderate pain may be encountered after the local anesthetic wears off. If not contraindicated by your health history, take two Extra-Strength TylenolR (500mg ea.) or   two MotrinR/AdvilR/Ibuprofen (200mg ea.).  Avoid aspirin-containing products since these may lead to more bleeding.  For moderate to severe pain, take the  prescription medication as prescribed for you.  Do not operate heavy machinery or make important decisions while on narcotics.        11.  In case of uncontrolled bleeding, extreme pain or unusual circumstances, please call the office immediately.         POSTOPERATIVE DIET SUGGESTIONS           1.  Avoid eating or drinking anything within the first hour after surgery.  After this time, drink lots of liquids  and eat soft, cool foods.  Avoid excessively hot liquids and foods for the first 24 hours.           2.  During the first week, avoid hard, crispy foods such as chips, hard pretzels, and popcorn.          3.  The following food choices are recommended  (“liquid foods on the day of the procedure, soft “mushy” foods during the first week advancing to regular food as tolerated”):     a. During the first few days, blenderized foods, soup broth, well-cooked pasta, eggs, yogurt, cottage cheese, Jell-O, pudding, custard, applesauce, bananas and ice cream.     b. After the first few days, gradually increase your diet to include: fish, chicken, meatloaf, mashed potatoes, cooked vegetables, soups and cereals, etc…

## 2012-12-13 NOTE — Progress Notes (Signed)
OMFS pre-surgical consult note    Chief Complaint   Patient presents with   . Dental Pain     pt was referred by [facility] for ext [phone] with trismus and severe pain in LR       Past Medical Hx:   Past Medical History   Diagnosis Date   . Irregular periods/menstrual cycles    . Anemia    . Abnormal Pap smear      Past Surgical Hx:   Past Surgical History   Procedure Laterality Date   . Pelvic laparoscopy       10/2007 - [facility]   . Ovary removal       07/2007 with ectopic   . Ectopic pregnancy surgery  2009       Meds:   Prior to Admission medications    Medication Sig Start Date End Date Taking? Authorizing Provider   HYDROcodone-acetaminophen (NORCO) 5-325 MG per tablet Take 1 tablet by mouth every 6 hours as needed for Pain   MDD  tablets   Yes [provider]   amoxicillin (AMOXIL) 500 MG capsule Take 500 mg by mouth 2 times daily   Yes [provider]   diphenhydrAMINE (BENADRYL) 25 MG tablet Take 25 mg by mouth every 6 hours as needed       Yes [provider]   Loratadine (CLARITIN PO) Take  by mouth.   Yes [provider]   oxyCODONE-acetaminophen (PERCOCET) 5-325 MG per tablet Take 1-2 tablets by mouth every 6 hours as needed for Pain   MDD 8 tablets 12/13/12   Dalinda Heidt, Mary Sella, DDS   ibuprofen (ADVIL,MOTRIN) 600 MG tablet Take 1 tablet (600 mg total) by mouth 4 times daily as needed for Pain 12/13/12 01/12/13  Nickie Deren, Mary Sella, DDS   chlorhexidine (PERIDEX) 0.12 % solution Take 15 mLs by mouth 2 times daily   Swish and spit out. Do not swallow. Do not eat/drink for 30 minutes after. 12/13/12 12/27/12  Lenox Bink, Mary Sella, DDS   prenatal multivitamin w/ folic acid (PRENAVITE) tablet Take 1 tablet by mouth daily        [provider]      (home meds)  Current Outpatient Prescriptions   Medication   . HYDROcodone-acetaminophen (NORCO) 5-325 MG per tablet   . amoxicillin (AMOXIL) 500 MG capsule   . diphenhydrAMINE (BENADRYL) 25 MG tablet   . Loratadine (CLARITIN PO)    . oxyCODONE-acetaminophen (PERCOCET) 5-325 MG per tablet   . ibuprofen (ADVIL,MOTRIN) 600 MG tablet   . chlorhexidine (PERIDEX) 0.12 % solution   . prenatal multivitamin w/ folic acid (PRENAVITE) tablet     No current facility-administered medications for this visit.      (current meds)  Allergies:  Nickel  Social:  reports that she has been smoking Cigarettes.  She has a 2.4 pack-year smoking history. She does not have any smokeless tobacco history on file. She reports that she drinks about 1.8 ounces of alcohol per week. She reports that she does not use illicit drugs.   Family Hx:  Family History   Problem Relation Age of Onset   . Breast cancer Neg Hx    . Cancer Neg Hx    . Eclampsia Neg Hx    . Diabetes Neg Hx    . Colon cancer Neg Hx    . Ovarian cancer Neg Hx    . Hypertension Neg Hx    . Preterm labor Neg Hx    .  Spont abortions Neg Hx    . Stroke Neg Hx    . Genetic Disorders Neg Hx    . Thrombosis Neg Hx        VS: There were no vitals taken for this visit.    General:  AAOx3                   ASA: I    Objective:              Extraoral Exam: No LAD, No TTP, Face symmetrical but has slightly more fullness in LR, MIO of 10mm with pain (trismus). Severe TTP along LR mandible.    Intraoral Exam: FOM soft and nonelevated, OP clear and uvula at midline, fair OH      Mallampati: II    Images: Current Pano in stacks  Findings:     TMJ: No obvious pathology, No hard tissue pathology                      Teeth position / impaction: CBI #17,32   Erupted #1,16           3rd molar root development 90 %      Assessment: 27 y.o. female with no significant PMH presents for ext of #1,13,6,17,32 with slight facial swelling and trismus. Explained to pt that we could only remove #32 today for an emergency visit but that she will need f/u to have the others extracted. #32 with caries and periapical radiolucency.    Procedure:    Discussed risks, benefits and alternatives of tx and pt gives written and verbal consent.   Admin 2 carps 2% lidocaine 1:100,000 epi.  FTMPF with appropriate bone removed and #32 sectioned as required.  Elevated and extracted #32.  Copious irrigation and necessary curettage. 3-0 chromic placed.  Hemostasis achieved and postop instructions given. Pt left clinic in good condition.    Difficult extraction. Mesial and distal roots were extremely curved and divergent. IANB was not visualized but was very close radiographically. Pt had difficult time getting numb and being comfortable for the procedure.    Rx: percocet, peridex, motrin    F/u 12/16/12    Mary Sella Darika Ildefonso, DDS

## 2012-12-16 ENCOUNTER — Ambulatory Visit: Payer: Self-pay | Admitting: Oral and Maxillofacial Surgery

## 2012-12-16 DIAGNOSIS — K029 Dental caries, unspecified: Secondary | ICD-10-CM

## 2012-12-16 DIAGNOSIS — K011 Impacted teeth: Secondary | ICD-10-CM | POA: Insufficient documentation

## 2012-12-16 HISTORY — DX: Impacted teeth: K01.1

## 2012-12-16 NOTE — Progress Notes (Signed)
OMFS pre-surgical consult note    Chief Complaint   Patient presents with   . Follow-up     pt 3days s/p difficult ext of #32...needs other wisdom teeth extracted       Past Medical Hx:   Past Medical History   Diagnosis Date   . Irregular periods/menstrual cycles    . Anemia    . Abnormal Pap smear      Past Surgical Hx:   Past Surgical History   Procedure Laterality Date   . Pelvic laparoscopy       10/2007 - [facility]   . Ovary removal       07/2007 with ectopic   . Ectopic pregnancy surgery  2009       Meds:   Prior to Admission medications    Medication Sig Start Date End Date Taking? Authorizing Provider   HYDROcodone-acetaminophen (NORCO) 5-325 MG per tablet Take 1 tablet by mouth every 6 hours as needed for Pain   MDD  tablets    [provider]   amoxicillin (AMOXIL) 500 MG capsule Take 500 mg by mouth 2 times daily    [provider]   oxyCODONE-acetaminophen (PERCOCET) 5-325 MG per tablet Take 1-2 tablets by mouth every 6 hours as needed for Pain   MDD 8 tablets 12/13/12   Karrisa Didio, Mary Sella, DDS   ibuprofen (ADVIL,MOTRIN) 600 MG tablet Take 1 tablet (600 mg total) by mouth 4 times daily as needed for Pain 12/13/12 01/12/13  Jancarlo Biermann, Mary Sella, DDS   chlorhexidine (PERIDEX) 0.12 % solution Take 15 mLs by mouth 2 times daily   Swish and spit out. Do not swallow. Do not eat/drink for 30 minutes after. 12/13/12 12/27/12  Denyse Fillion, Mary Sella, DDS   prenatal multivitamin w/ folic acid (PRENAVITE) tablet Take 1 tablet by mouth daily        [provider]   diphenhydrAMINE (BENADRYL) 25 MG tablet Take 25 mg by mouth every 6 hours as needed        [provider]   Loratadine (CLARITIN PO) Take  by mouth.    [provider]      (home meds)  Current Outpatient Prescriptions   Medication   . HYDROcodone-acetaminophen (NORCO) 5-325 MG per tablet   . amoxicillin (AMOXIL) 500 MG capsule   . oxyCODONE-acetaminophen (PERCOCET) 5-325 MG per tablet   . ibuprofen (ADVIL,MOTRIN) 600 MG tablet   .  chlorhexidine (PERIDEX) 0.12 % solution   . prenatal multivitamin w/ folic acid (PRENAVITE) tablet   . diphenhydrAMINE (BENADRYL) 25 MG tablet   . Loratadine (CLARITIN PO)     No current facility-administered medications for this visit.      (current meds)  Allergies:  Nickel  Social:  reports that she has been smoking Cigarettes.  She has a 2.4 pack-year smoking history. She does not have any smokeless tobacco history on file. She reports that she drinks about 1.8 ounces of alcohol per week. She reports that she does not use illicit drugs.   Family Hx:  Family History   Problem Relation Age of Onset   . Breast cancer Neg Hx    . Cancer Neg Hx    . Eclampsia Neg Hx    . Diabetes Neg Hx    . Colon cancer Neg Hx    . Ovarian cancer Neg Hx    . Hypertension Neg Hx    . Preterm labor Neg Hx    . Spont abortions Neg Hx    .  Stroke Neg Hx    . Genetic Disorders Neg Hx    . Thrombosis Neg Hx        VS: There were no vitals taken for this visit.    General:  AAOx3                   ASA: II    Subjective:  Pt reports feeling much better with increased opening and decreased pain and swelling the past 48 hours.  Pt reports only taking ibuprofen      Objective:              Extraoral Exam: No LAD, No TTP, Face symmetrical, TMJ WNL with no pain    Intraoral Exam: FOM soft and nonelevated, OP clear and uvula at midline, fair OH, MIO 35mm. #32 ext site in LR is healing as expected      Mallampati: II    Images: Current Pano in stacks  Findings:     TMJ: No obvious pathology, No hard tissue pathology                      Teeth position / impaction: erupted #1,16   CBI #17           Gross caries #13      Assessment: 27 y.o. female with no significant PMH reports for follow-up. Pt is healing and progressing well. Pt needs to have #1,13,16,17 extracted due to either supereruption caries or impaction.      Informed Consent:   I fully discussed the rationale, technical details, risks/benefits, pros, cons and alternatives of surgery and  anesthesia including no treatment as an option. Preoperative and postoperative expectations were reviewed. I reviewed with patient the risks and alternatives of Local, N2O, and IV Sedation    I fully discussed the following potential complications: Pain, Bleeding, Swelling, Bruising, Infection, numbness and damage to adjacent structures as well as our attempts to minimize these risks.    Questions were encouraged and answered to the patient's apparent satisfaction.   The patient was given the following written information: Preoperative information    Surgical Plan/Indications for Procedure:   1) ext #1,13,16,17   2) Anesthesia: IV sedation  3) Time required: 90 min      All questions answered and pt left clinic in good condition    Mary Sella Rece Zechman, DDS

## 2012-12-27 ENCOUNTER — Telehealth: Payer: Self-pay

## 2012-12-27 NOTE — Telephone Encounter (Signed)
Instructions given NPO after midnight. Responsible adult must stay in waiting room during the procedure and to drive patient home.

## 2012-12-28 ENCOUNTER — Ambulatory Visit: Payer: Self-pay | Admitting: Dentist

## 2012-12-28 ENCOUNTER — Encounter: Payer: Self-pay | Admitting: Dentist

## 2012-12-28 VITALS — BP 125/58 | HR 81 | Resp 15 | Ht 63.0 in | Wt 193.0 lb

## 2012-12-28 DIAGNOSIS — K011 Impacted teeth: Secondary | ICD-10-CM

## 2012-12-28 MED ORDER — CHLORHEXIDINE GLUCONATE 0.12 % MT SOLN *I*
15.0000 mL | Freq: Two times a day (BID) | OROMUCOSAL | Status: DC
Start: 2012-12-28 — End: 2013-01-18

## 2012-12-28 MED ORDER — FENTANYL CITRATE 50 MCG/ML IJ SOLN *WRAPPED*
INTRAMUSCULAR | Status: DC
Start: 2012-12-28 — End: 2012-12-28
  Filled 2012-12-28: qty 2

## 2012-12-28 MED ORDER — MIDAZOLAM HCL 5 MG/5ML IJ SOLN *I*
INTRAMUSCULAR | Status: DC
Start: 2012-12-28 — End: 2012-12-28
  Filled 2012-12-28: qty 10

## 2012-12-28 MED ORDER — IBUPROFEN 600 MG PO TABS *I*
600.0000 mg | ORAL_TABLET | Freq: Four times a day (QID) | ORAL | Status: AC | PRN
Start: 2012-12-28 — End: 2013-01-27

## 2012-12-28 MED ORDER — OXYCODONE-ACETAMINOPHEN 5-325 MG PO TABS *I*
1.0000 | ORAL_TABLET | Freq: Four times a day (QID) | ORAL | Status: DC | PRN
Start: 2012-12-28 — End: 2013-01-18

## 2012-12-28 MED ORDER — SODIUM CHLORIDE 0.9 % IV SOLN WRAPPED *I*
1000.0000 mL | Status: AC
Start: 2012-12-28 — End: 2012-12-29
  Administered 2012-12-28: 1000 mL via INTRAVENOUS

## 2012-12-28 MED ORDER — PROPOFOL 10 MG/ML IV EMUL (INTERMITTENT DOSING) WRAPPED *I*
INTRAVENOUS | Status: DC
Start: 2012-12-28 — End: 2012-12-28
  Filled 2012-12-28: qty 20

## 2012-12-28 NOTE — Preop H&P (Signed)
AMBULATORY  Chief Complaint:   Chief Complaint   Patient presents with   . Procedure     Ext 1, 13, 16, 17 with IV Sed       History of Present Illness:  HPI  27 year old female referred for extraction of supraerupted and malpositioned third molars and retained root #13.    Past Medical History   Diagnosis Date   . Irregular periods/menstrual cycles    . Anemia    . Abnormal Pap smear      Past Surgical History   Procedure Laterality Date   . Pelvic laparoscopy       10/2007 - [facility]   . Ovary removal       07/2007 with ectopic   . Ectopic pregnancy surgery  2009     Family History   Problem Relation Age of Onset   . Breast cancer Neg Hx    . Cancer Neg Hx    . Eclampsia Neg Hx    . Diabetes Neg Hx    . Colon cancer Neg Hx    . Ovarian cancer Neg Hx    . Hypertension Neg Hx    . Preterm labor Neg Hx    . Spont abortions Neg Hx    . Stroke Neg Hx    . Genetic Disorders Neg Hx    . Thrombosis Neg Hx      History     Social History   . Marital Status: Single     Spouse Name: N/A     Number of Children: N/A   . Years of Education: N/A     Social History Main Topics   . Smoking status: Current Some Day Smoker -- 0.30 packs/day for 8 years     Types: Cigarettes   . Smokeless tobacco: None   . Alcohol Use: 1.8 oz/week     3 Cans of beer per week      Comment: socially   . Drug Use: No   . Sexual Activity: Yes     Partners: Male     Birth Control/ Protection: None     Other Topics Concern   . None     Social History Narrative   . None       Allergies:   Allergies   Allergen Reactions   . Nickel Hives       Medications:  Current Outpatient Prescriptions   Medication   . oxyCODONE-acetaminophen (PERCOCET) 5-325 MG per tablet   . diphenhydrAMINE (BENADRYL) 25 MG tablet   . Loratadine (CLARITIN PO)   . ibuprofen (ADVIL,MOTRIN) 600 MG tablet   . chlorhexidine (PERIDEX) 0.12 % solution   . HYDROcodone-acetaminophen (NORCO) 5-325 MG per tablet   . amoxicillin (AMOXIL) 500 MG capsule   . prenatal multivitamin w/ folic acid (PRENAVITE)  tablet     Current Facility-Administered Medications   Medication Dose Route Frequency   . sodium chloride 0.9 % IV 1,000 mL  1,000 mL Intravenous Continuous   . fentaNYL (SUBLIMAZE) 0.05 MG/ML injection       . midazolam (VERSED) 1 mg/mL injection       . propofol (DIPRIVAN) 10 MG/ML injection            Review of Systems:   Review of Systems   Constitutional: Negative for fever and chills.   HENT: Negative.    Eyes: Negative.    Respiratory: Negative for cough and shortness of breath.    Cardiovascular: Negative.  Gastrointestinal: Negative.  Negative for heartburn, nausea and vomiting.   Genitourinary: Negative.    Musculoskeletal: Negative.    Skin: Negative.  Negative for itching and rash.   Neurological: Negative.  Negative for headaches.   Psychiatric/Behavioral: The patient is nervous/anxious.        Blood pressure 123/77, pulse 88, resp. rate 21, height 1.6 m (5\' 3" ), weight 87.544 kg (193 lb), SpO2 100.00%.    Physical Exam   Vitals reviewed.  Constitutional: She is oriented to person, place, and time. She appears well-developed and well-nourished.   HENT:   Head: Normocephalic and atraumatic.   Right Ear: External ear normal.   Left Ear: External ear normal.   Nose: Nose normal.   Mouth/Throat: Uvula is midline, oropharynx is clear and moist and mucous membranes are normal. Abnormal dentition. No dental caries.   Eyes: Conjunctivae and EOM are normal. Pupils are equal, round, and reactive to light.   Neck: Normal range of motion.   Cardiovascular: Normal rate, regular rhythm and normal heart sounds.    Pulmonary/Chest: Effort normal and breath sounds normal. No respiratory distress. She has no wheezes.   Abdominal: Bowel sounds are normal.   Musculoskeletal: Normal range of motion.   Neurological: She is alert and oriented to person, place, and time. No cranial nerve deficit.   Skin: Skin is warm and dry.   Psychiatric: She has a normal mood and affect. Her behavior is normal. Judgment and thought  content normal.       Radiology impressions (last 30 days):  PANO:  Suprerupted 1, 16.  Malpositioned 17.  Retained root #13.    Currently Active Problems:  Patient Active Problem List   Diagnosis Code   . Caries 521.00   . Impacted third molar tooth 520.6        Assessment:   27 year old female with PMH of irregular periods and abnormal pap smear.  PSH ovary removal and pelvic lap.  Allergies to Nickel.  MP2.    Plan:   Ext 1, 13, 16, 17 with IV Sedation.    Author: Gerlene Burdock, DDS  Note created: 12/28/2012  at: 10:28 AM

## 2012-12-28 NOTE — Progress Notes (Signed)
Instructions given for pre and post procedure and medications.  Medication instructions offered

## 2012-12-28 NOTE — Procedures (Signed)
Date: 12/28/2012  Patient Name: Darlene Villegas       DOB:17-Nov-1985              Preoperative Diagnosis: Impacted and nonrestorable teeth         Planned Procedure: Ext 1, 13, 16, 17 with IV Sed    Anesthesia:    I reviewed technical details, risks, benefits, and alternative treatment and anesthetic options with the patient (and family).  An opportunity was given for questions.  All questions were answered to the apparent satisfaction of the patient and family.  All wish to proceed.    Health History:reviewed and in chart, without changes since consultation visit    ASA  Class : II    Exam:  Malampati: II  Anesthesia as per record.      Local Anesthetic was administered via blocks and infiltrations.         # of carpules  2% Lidocaine with 1:100000 Epi 6  3% Mepivicaine    0  4% Septocaine with 1:100000 Epi 0  0.50% Marcaine   1  0.25% Marcaine   0    Procedure:   Timeout taken with surgical team and patient describing appropriate procedure. IV sedation as per record.  levate and and Extract #1 with 77R and forceps.  FTMP envelope flap made using an sulcular incision made from MB #18 to DB #17 with 15 blade. Fissure bur used to trough buccal bone and section #17.  Sections elevated with 77R and delivered with forceps.   FTMP envelope flap made using an sulcular incision made from DB # 12 to DB #14 with 15 blade. Fissure bur used to removed bone. Elevate and Ext #13.   Elevate and Ext #16 with 77R and forceps.  Sockets curetted.  Rinsed copiously with saline. Three 3-0 chromic gut sutures placed.  Gauze placed after adequate hemostasis.  POIG verbally and in writing.     Throat screen: yes  Bite Block / Jaw stabilized for TMJ prophylaxis: yes     Throat screen removed: yes   Oral gauze packs placed for hemostasis: yes    Monitored in Recovery until alert and vital signs stable then discharged EL:FYBO    The wounds were hemostatic at discharge.    Complications:  None    Rx Medications: Motrin, Peridex and  percocet     Postoperative Instructions given and explained to patient and escort  Follow up appointment arranged for: prn

## 2012-12-28 NOTE — Patient Instructions (Signed)
ORAL & MAXILLOFACIAL SURGERY    POSTOPERATIVE INSTRUCTIONS FOLLOWING DENTAL SURGERY AND SEDATION    After receiving sedative medications you may be drowsy for up to 24hrs.  DO NOT drive or operate heavy machinery for 24hrs, DO NOT drink alcoholic beverages for 24hrs, DO NOT make major decisions, sign contracts, etc. for 24hrs.    DO NOT drive, operate heavy machinery, drink alcoholic beverages or make major decisions or sign important documents while taking narcotic pain medications.     First 30 minutes after procedure:  Bite on gauze for 30 minutes after your procedure, to help stop bleeding.  It is best that you do this without checking the site until the 30 minutes is up. Do not chew on the gauze, as continued pressure is desired.  During this time, do not speak or do other activities that could dislodge the gauze packs.  If bleeding continues after 30 minutes, bite on fresh gauze as follows:  dampen the gauze first so that it is moist (not wet) and fold it twice.  Place the dampened gauze on the extraction sites for another 30 minutes.  Repeat as necessary.  A small amount of continued blood, which tinges the saliva, is expected during the first 24 hours and does not require further pressure.  If bleeding restarts after it has stopped, use gauze pressure again or consider gently biting on a dry tea bag placed over the extraction site for 30 minutes.  If the bleeding does not stop despite repeated attempts as above, please call [phone].    Avoid excessive exertion for 72 hours after your procedure.  No bending, lifting, strenuous activity,sports or gym-class.  After this time, resume normal activity gradually as you become comfortable.    Mouth opening may be limited during the first week to ten days after surgery.  This is expected due to swelling and tightness of the jaw muscles.   Bruising (black and blue areas) may develop after surgery.  Should this occur, expect bruising to resolve after the first week to  ten days.   5.   Earache or TMJ (jaw joint) tenderness may develop that will gradually resolve over the first week.          6.   NO ORAL CARE during the first 24 hours.  Beginning on the day after surgery, rinse your mouth after meals (or    4-5 times per day) with warm salt water (1/2 teaspoon of salt in an 8oz. glass of warm water).  A prescription mouth rinse may be given in selected cases. Begin tooth brushing on the day after surgery, as you normally  would however this should be performed gently in the area of extractions.         7.  DO NOT USE STRAWS for the first week after surgery.  Straws create suction pressure inside your mouth and could dislodge the blood clots that normally form in the extraction sites.          8.  DO NOT SMOKE during the first 21 days after surgery.  Smoking greatly increases the risk of  and other healing complications including infection, pain, and dry socket.          9.  SWELLING is normal and will maximize 48 - 72 hours after surgery.  Apply ice in a damp towel against your cheeks for the first 24-48 hours, as follows: 20 minutes on 40 minutes off, then repeat. Keep your head elevated with 2 or 3   pillows when lying down during the first 24 hours.  This will reduce bleeding, swelling and improve your level of comfort.  May apply VaselineR or ChapstickR to the lips to keep them moist.        10.  Mild to moderate pain may be encountered after the local anesthetic wears off. If not contraindicated by your health history, take two Extra-Strength TylenolR (500mg ea.) or two MotrinR/AdvilR/Ibuprofen (200mg ea.).  Avoid aspirin-containing products since these may lead to more bleeding.  For moderate to severe pain, take the  prescription medication as prescribed for you.  Do not operate heavy machinery or make important decisions while on narcotics.        11.  In case of uncontrolled bleeding, extreme pain or unusual circumstances, please call the office immediately.          POSTOPERATIVE DIET SUGGESTIONS           1.  Avoid eating or drinking anything within the first hour after surgery.  After this time, drink lots of liquids  and eat soft, cool foods.  Avoid excessively hot liquids and foods for the first 24 hours.           2.  During the first week, avoid hard, crispy foods such as chips, hard pretzels, and popcorn.          3.  The following food choices are recommended (“liquid foods on the day of the procedure, soft “mushy” foods during the first week advancing to regular food as tolerated”):     a. During the first few days, blenderized foods, soup broth, well-cooked pasta, eggs, yogurt, cottage cheese, Jell-O, pudding, custard, applesauce, bananas and ice cream.     b. After the first few days, gradually increase your diet to include: fish, chicken, meatloaf, mashed potatoes, cooked vegetables, soups and cereals, etc…     SINUS PRECAUTIONS (if indicated)    Keep your head elevated during sleep with two to three pillows.  Do NOT blow your nose   If you sneeze do not sneeze through the nose and do not “hold back.”  You may sneeze with your mouth open.    Expect slight nose bleeds.  Call the office immediately for heavy bleeding from the nose that you are unable to control with pressure.   Do NOT do anything that requires a sucking-in or blowing-out motion such as smoking, use of straws or blowing up balloons.   Take all prescribed and/or recommended medications as directed.

## 2013-01-18 ENCOUNTER — Encounter: Payer: Self-pay | Admitting: Obstetrics and Gynecology

## 2013-01-18 ENCOUNTER — Ambulatory Visit: Payer: Self-pay | Admitting: Obstetrics and Gynecology

## 2013-01-18 VITALS — BP 142/81 | HR 68 | Ht 62.99 in | Wt 210.0 lb

## 2013-01-18 MED ORDER — METRONIDAZOLE 0.75 % VA GEL *I*
1.0000 | Freq: Every evening | VAGINAL | Status: AC
Start: 2013-01-18 — End: 2013-01-23

## 2013-01-18 NOTE — Progress Notes (Signed)
Subjective:      Darlene Villegas is a 27 y.o. premenopausal female who presents for an annual exam. The patient has no complaints today.  Thinks she may have BV infection.  Has noticed increased vaginal discharge and odor.    GYN History  LMP: 01/03/2013  Menses:flow is moderate, regular every 31 days without intermenstrual spotting and usually lasting 3 to 4 days  Cramps: Moderate  Menarche: 27 yo: [place]: 27 yo  Last pap smear: Date: 02/28/08 Results: ASCUS with POSITIVE high risk HPV  History of abnormal pap smear: Yes. Previous abnormality:ASCUS with POSITIVE high risk HPV,2009  The patient is sexually active.   Sexually active: single partner, contraception - none  Together with partner: 5 years  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, chlamydia, GC  Current contraception: none        Obstetric History    G2   P0   T0   P0   A2   TAB0   SAB0   E2   M0   L0        Screening History:  Gyn screening history: last pap: was abnormal: ASCUS, +HPV  The patient has never been taking hormone replacement therapy.  The patient wears seatbelts:yes  The patient participates in regular exercise: yes  Has the patient ever been transfused or tattooed?:yes; tattoos  Lives alone  Domestic violence:No  Employed with [employer]  Immunizations are current; will get flu vaccine today  Denies symptoms of depression  Health care proxy provided  Calcium reviewed and supplement recommended.  Also will start prenatal vitamin    Patient's medications, allergies, past medical, surgical, social and family histories were reviewed and updated as appropriate.    Review of Systems  Pertinent items are noted in HPI.      Objective:      BP 142/81  Pulse 68  Ht 1.6 m (5' 2.99")  Wt 95.255 kg (210 lb)  BMI 37.21 kg/m2  LMP 01/03/2013  General appearance: alert, appears older than stated age, moderately obese and oriented x3  Neck: no adenopathy, supple, symmetrical, trachea midline and thyroid not enlarged,  symmetric, no tenderness/mass/nodules  Lungs: clear to auscultation bilaterally  Breasts: normal appearance, no masses or tenderness  Heart: regular rate and rhythm, S1, S2 normal, no murmur, click, rub or gallop  Abdomen: soft, non-tender; bowel sounds normal; no masses,  no organomegaly  Pelvic: adnexae not palpable, cervix normal in appearance, external genitalia normal, no cervical motion tenderness, rectovaginal septum normal, uterus normal size, shape, and consistency and vagina normal without discharge  Extremities: extremities normal, atraumatic, no cyanosis or edema  Skin: Skin color, texture, turgor normal. No rashes or lesions  Lymph nodes: Cervical, supraclavicular, and axillary nodes normal. Microscopic wet mount shows KOH done, clue cells.      Assessment:      Darlene Villegas is a 27 y.o. female who presents for an annual exam. She has the following concerns: 1) Bacterial vaginosis  2) due for flu vaccine     Plan:       Preventative health: Healthy women's lifestyle handout., Breast self exam technique reviewed and patient encouraged to perform self-exam monthly., Nutrition., Calcium. and prenatal vitamin  Sexual health and contraception: The risks and prevention of STD's were reviewed. and Safer sex practices were reviewed.  Nutrition/Exercise reviewed: Counseled on regular exercise. Discussed duration, frequency, intensity, and types of exercise.  Vaccinations: Influenza 0.105ml IM x1 now to be administered by nursing  staff  Pap smear and cervical cultures were collected and will notify patient of abnormal results  Declined HIV testing today  RX:  Metrogel vaginal; 1 applicator qhs x5d.  Recalls side effects from prior use  Reviewed vaginitis prevention measures  RTC prn and for annual in one year.

## 2013-01-18 NOTE — Progress Notes (Signed)
Nutritional Risk Assessment      Obesity (>30 BMI) There is no height or weight on file to calculate BMI.  Body mass index is 37.21 kg/(m^2).       Patient requests nutritional consultation No

## 2013-01-18 NOTE — Patient Instructions (Signed)
Healthy Practices for Women of all Ages     In addition to your regular OB/GYN history, physical, cholesterol screening, Pap smear, and other recommended cancer screening tests, we ask that you review this list of “healthy practices.”  Modifying your daily activities according to these practices may improve your overall health and well-being.  In the event of questions about this list, ask your doctor or health care provider.  Additionally, there are specially written pamphlets available, which offer further information.    Diet and Exercise:  - Limit fat and cholesterol; emphasize fruits, grains and vegetables.  - Consume dairy products or use calcium supplementation for adequate calcium intake (1200 mg or more).  - Add folic acid supplementation 0.4 mg (400 micrograms, present in most daily multivitamins) at least two months before considering pregnancy to reduce the risks of birth defects.  - Participate in regular exercise for 30 minutes at least five times a week, and consider weight training.    Injury Prevention:  - Seat/lap belts should be worn while in a moving car.  - Helmet should be used when using motorcycles, bicycles, roller blades and ATV’s or skiing.  - Place approved smoke detectors in your house and replace the batteries twice a year.    - Guns and other firearms should be stored unloaded and in a locked area.  Trigger locks should be used as well.  - Consider CPR training for household members.    Dental Health:  - Schedule regular visits to the dentist.  - Floss and brush with fluoride toothpaste daily.    Immunizations:  - A tetanus/diphtheria booster shot (d/T) is recommended every 10 years.  - An MMR vaccine is recommended for non-pregnant women born after 1957 without proof of immunity of documentation of previous immunization.  Adults who are susceptible to varicella (chicken pox) should be vaccinated.  - Influenza vaccine is indicated yearly for women age 65 and older, or at any age based  on medical history and exposure.  Pregnant women over [redacted] weeks gestation should receive the flu vaccine as well.  - Pneumococcal pneumonia vaccine is indicated for age 65 and older once in your life.  - Hepatitis A and/or B vaccines are recommended for high-risk individuals.    Substance Abuse:  - Stop smoking; do not use any other tobacco products.  - Avoid alcohol use when driving, boating, swimming or operating other machinery. Avoid excessive use of alcoholic beverages.  - Recreational drug use (marijuana, cocaine, etc.) is dangerous and can be habit-forming.    Sexual Behavior:  - Be sure to use contraception if pregnancy is not desired.  - Regular use of female or female condoms with spermicide helps prevent STD’s.  - Consider HIV testing if:  1. You have had more than one sexual partner.  2. You have had any STD’s.  3. You have used intravenous drugs.  4. You have a sexual partner with these (the above) risk factors.  5. Your sexual partner has had female homosexual exposure.  6. You received a blood transfusion during 1978-1985.    Breast Health:  - Breast self-examinations should be done monthly (after your period).  - A mammogram should be done once between ages 35-40, then every 1-2 years based on risk factors.  If there is a strong family history of premenopausal breast cancer, you should start with mammograms earlier than age 35.    Colon Cancer Surveillance:  - Beginning at age 50, stool occult blood screening should   be done annually and/or sigmoidoscopy (scope examination of the colon) every 3-5 years.    Women and Alcohol  For women, alcohol use carries some unique and special concerns. Most studies on alcohol have been on men, but we now know women may respond differently to alcohol.   Concerns: Liver damage, cancer, menstrual cycle changes, birth defects, fetal alcohol syndrome, intoxication, nutrition and weight management.  All women should avoid all alcohol during pregnancy.  Help for Problem  Drinking:  • One in every three members of Alcoholics Anonymous (AA) is now a woman.  AA meetings are held regularly and have led the way in demonstrating the effectiveness of self-help.  • Outpatient and Inpatient Treatment are also available.  Ask your health care provider for a referral.  • The first step in successful treatment is to recognize that there is a problem and accept help.    Dietary and Supplemental Calcium  It is important that you build and protect your bone mass through a program of regular exercise and proper nutrition with adequate amounts of calcium in your diet. Dairy products are considered to be the most important sources of calcium.    Reviewed 12/2009

## 2013-01-19 LAB — N. GONORRHOEAE DNA AMPLIFICATION: N. gonorrhoeae DNA Amplification: 0

## 2013-01-19 LAB — CHLAMYDIA PLASMID DNA AMPLIFICATION: Chlamydia Plasmid DNA Amplification: 0

## 2013-01-25 LAB — GYN CYTOLOGY

## 2013-02-03 ENCOUNTER — Telehealth: Payer: Self-pay | Admitting: Obstetrics and Gynecology

## 2013-02-03 NOTE — Telephone Encounter (Signed)
TC to patient informing of abnormal pap and need for colpo.  Please schedule appointment with one of the NPs for colpo and call pt with appointment.  Thanks, Deb    Reason for referral: LGSIL  Referring provider: D. Giah Fickett, NP  Pap/colpo Hx: history of HPV  STD history including HIV status:HPV, chlamydia, GC  Smoker? Unknown  Pregnancy status: not pregnant   Present symptoms: none

## 2013-02-08 NOTE — Telephone Encounter (Signed)
Colpo appointment scheduled for Tues. 03-14-13 at 1:45 PM with Fanny Bien, NP  by phone, no letter sent.

## 2013-03-14 ENCOUNTER — Ambulatory Visit: Payer: Self-pay | Admitting: Obstetrics and Gynecology

## 2013-03-14 ENCOUNTER — Encounter: Payer: Self-pay | Admitting: Obstetrics and Gynecology

## 2013-03-14 VITALS — BP 151/72 | HR 94 | Ht 63.0 in | Wt 206.0 lb

## 2013-03-14 DIAGNOSIS — Z01812 Encounter for preprocedural laboratory examination: Secondary | ICD-10-CM

## 2013-03-14 DIAGNOSIS — Z720 Tobacco use: Secondary | ICD-10-CM

## 2013-03-14 DIAGNOSIS — R87619 Unspecified abnormal cytological findings in specimens from cervix uteri: Secondary | ICD-10-CM

## 2013-03-14 LAB — POCT URINE PREGNANCY: Lot #: 700547

## 2013-03-14 MED ORDER — IBUPROFEN 600 MG PO TABS *I*
600.0000 mg | ORAL_TABLET | Freq: Three times a day (TID) | ORAL | Status: DC | PRN
Start: 2013-03-14 — End: 2018-10-18

## 2013-03-14 MED ORDER — IBUPROFEN 600 MG PO TABS *I*
600.0000 mg | ORAL_TABLET | Freq: Once | ORAL | Status: AC
Start: 2013-03-14 — End: 2013-03-14
  Administered 2013-03-14: 600 mg via ORAL

## 2013-03-14 NOTE — Procedures (Signed)
Colposcopy Procedure Note    Indication for colposcopy:   This is a 27 y.o. G2P0020 with most recent pap smear 2 months ago showing: low-grade squamous intraepithelial neoplasia (LGSIL - encompassing HPV,mild dysplasia,CIN I).     Dysplasia History:    02/28/08 pap ASCUS, + HPV  2008 pap - negative    Risk Factors:  Tobacco: cigaret smoking off and on. Not on a daily basis  Prior STDs: HPV, Chlamydia  HIV status: Negative  HPV vaccine: received series at [facility]    UPT result:   Preg Test,UR POC   Date Value Range Status   01/14/2011 Negative-Dilute urine specimens may cause false negative urine pregnancy results...  Negative-Dilute urine specimens may cause false negative urine pregnancy results... Final     LMP:  01/31/2013  Current Contraception method: condoms: 100% of the time    History: Medical, surgical, family, and social history, as well as medications and allergies, were all verified and updated.    Domestic violence:No    Consent:  The procedure risks, benefits and side effects were explained to the patient. Questions were answered and an informed consent was signed.    Pre-Procedural Time Out:  Date: 03/14/2013       Time: 2: 10 PM PM    Correct Procedure: Yes  Correct Patient: (use 2 Identifiers) Yes  Correct Site: Yes  Correct Patient Position: Yes  Appropriate Hand Hygiene Used: Yes  List Any Participants Involved in Time-Out: patient, Gloriann Loan, NP  Availability of correct implants and any special equipment: Yes    Procedure Details:  The patient was placed in the dorsal lithotomy position. Speculum was placed and the cervix was visualized. Acetic acid was applied to the cervix.     Squamarcolumnar junction seen: yes  Transformation zone seen: yes  Canal clear: yes  Atypical vessels: no  Endocervical curettage performed: no  Cervical biopsies taken: yes, at 7 o'clock    No images are attached to the encounter.    Patient tolerated the procedure well and post procedure pain was rated as 2.   The  patient received 600 mg of ibuprofen in the office.   Time procedure ended: 2: 10 pm.    Specimens to Pathology: cervix bx 7:00    Impression:   Colposcopy was adequate  Colpscopic diagnosis: HPV related changes    Plan:  Plan was reviewed with the patient including and handout given:  - Patient will be called to discuss results and further plan, may leave message  - Post colposcopy instructions were reviewed with the patient including:   Warning signs and symptoms of infection and abnormal bleeding   Instructed to avoid coitus, douching and tampons for 14 days.  Questions were answered and the patient states good understanding of instructions.   - Smoking cessation: encourage. Reviewed risk for abnormal cells on the cervix with cigarette smoking  Gloriann Loan, NP 03/14/2013 1:56 PM

## 2013-03-14 NOTE — Addendum Note (Signed)
Addended by: Landis Gandy on: 03/14/2013 04:59 PM     Modules accepted: Orders

## 2013-03-14 NOTE — Patient Instructions (Signed)
[facility]  Women’s Health Practice    Colposcopy Teaching Sheet    Patient Name:_____________________________________               Date: _______________      Procedure:  MR #:__________________________________Provider_________________________               ?   Colposcopy                                                                                                                                                               ?   Colpo with biopsy  Pap Smears  A pap smear is a screening test for cancer of the cervix.  A pap smear brush is used to collect cells from the cervix.  The  Pap smear is sent to the laboratory to check for any abnormal cells.  It is very important that women have a pap smear yearly.   If we can find and treat mild changes in the cervical cells we can prevent them from changing to cancer.     A pap smear that is abnormal does not mean you have cancer of the cervix.  There are 2 levels of abnormal pap smears, low   grade and high grade.     Levels of Pap Smears    Normal Pap Smear Low Grade High Grade Cervical Cancer    Low grade means your pap smear is close to being normal but not quite.    Depending on your history of abnormal pap smears, you may require a colposcopy (or possibly just a repeat pap smear).  If this is our first ASCUS or abnormal pap; your pap smear will be tested for HPV.  If it is a low-risk type of HPV, you may just need a repeat pap smear in 3-6 months.    If it is a high-risk type of HPV, a colposcopy exam is required.  High grade means there are a higher degree of dysplasia, or more changes on the cells of the cervix.  A colposcopy if required.            Page 2    What is a Colposcopy?  Your provider will use a colposcope to take a closer look at your cervix.  The colposcope is like a microscope.  The colposcope does not touch you and it does not go in your vagina.     The Examination  A colposcopy takes about 15 minutes.  That is because your provider  is examining your cervix while looking through the colposcope to see any abnormal areas better.  Another pap smear might be done at this time.  If your provider sees an abnormal area, a small biopsy may be taken to see if there are any   abnormal cells below the surface.  The biopsy is about the size of the head of a pin.  It may feel like a quick pinch or you may not feel it at all.  Some patients cramp with a biopsy.    Follow-up  After your colposcopy, your provider will be contacting you with the results and discussing your next follow-up appointment.  Following up is very important.  There are no symptoms of an abnormal pap smear.  Abnormal pap smears can stay the same, improve or get worse.  It is very important to get the next pap smear or another colposcopy when the doctor says to.   Remember, early detection and proper treatment of abnormalities can help prevent the development of cervical cancer.    Discharge Instructions for a Colpo Biopsy    If a biopsy is done, you can expect a small amount of spotting or bleeding, brown-black discharge that may look like coffee grounds and mild cramping for up to a few weeks.  You may take Tylenol or Ibuprofen to relieve cramps.   If you are pregnant you may take Tylenol, no Ibuprofen.   If you do not have these symptoms, it is okay.    You must not put anything in your vagina for 2 weeks.  This allows the area where the biopsy was done to heal.  No tampons,   no douching and no intercourse.     Please call our office, even after-hours, if you have:  1. Heavy bleeding that is greater than the heaviest day of your period or if you soak a pad, top to bottom in 1 hour.  2. Bad cramping or pain not relieved with Tylenol or Ibuprofen;  3. Foul-smelling vaginal discharge;  4. A fever of 101 degrees or greater.  5.

## 2013-03-16 LAB — SURGICAL PATHOLOGY

## 2013-03-17 ENCOUNTER — Telehealth: Payer: Self-pay | Admitting: Obstetrics and Gynecology

## 2013-03-17 DIAGNOSIS — R87619 Unspecified abnormal cytological findings in specimens from cervix uteri: Secondary | ICD-10-CM

## 2013-03-17 NOTE — Telephone Encounter (Signed)
Colposcopy biopsy confirms a mild change (LSIL) to the cell of the cervix. Recommendations are for repeat pap/HPV in 1 year. Office will notify patient of results and plan. Pap/HPV can be collected with AGY next year.

## 2013-03-17 NOTE — Telephone Encounter (Signed)
LMOVM for pt to call

## 2013-03-20 NOTE — Telephone Encounter (Signed)
Spoke with pt- requests appt to evaluate her complaint of malodorous vaginal discharge following a colposcopy last Tuesday- pt also states she notes ? Piece of tissue passage from vagina. Referencing Post colposcopy instructions- pt was offered appt for tomorrow- pt declined- prefers to see only her provider- appt scheduled for Wednesday am. Danger signs reviewed- pt agrees to seek urgent eval if needed.    Will forward to provider to review and advise and as FYI

## 2013-03-20 NOTE — Telephone Encounter (Signed)
Patient is calling about colposcopy said that she didn't receive message this morning.

## 2013-03-22 ENCOUNTER — Ambulatory Visit: Payer: Self-pay | Admitting: Obstetrics and Gynecology

## 2013-03-31 ENCOUNTER — Telehealth: Payer: Self-pay

## 2013-03-31 ENCOUNTER — Ambulatory Visit
Admit: 2013-03-31 | Discharge: 2013-03-31 | Disposition: A | Discharge status: Home / Self Care | Payer: Self-pay | Source: Ambulatory Visit | Attending: Internal Medicine | Admitting: Internal Medicine

## 2013-03-31 LAB — BASIC METABOLIC PANEL
Anion Gap: 13 (ref 7–16)
CO2: 23 mmol/L (ref 20–28)
Calcium: 8.7 mg/dL — ABNORMAL LOW (ref 8.8–10.2)
Chloride: 102 mmol/L (ref 96–108)
Creatinine: 0.69 mg/dL (ref 0.51–0.95)
GFR,Black: 137 *
GFR,Caucasian: 119 *
Glucose: 170 mg/dL — ABNORMAL HIGH (ref 60–99)
Lab: 10 mg/dL (ref 6–20)
Potassium: 4 mmol/L (ref 3.3–5.1)
Sodium: 138 mmol/L (ref 133–145)

## 2013-03-31 LAB — LIPID PANEL
Chol/HDL Ratio: 2.7
Cholesterol: 175 mg/dL
HDL: 66 mg/dL
LDL Calculated: 93 mg/dL
Non HDL Cholesterol: 109 mg/dL
Triglycerides: 79 mg/dL

## 2013-03-31 NOTE — Telephone Encounter (Signed)
Patient is calling for colposcopy results. Please contact.

## 2013-04-01 LAB — HEPATITIS B & C PROF
HBV Core Ab: NEGATIVE
HBV S Ab Quant: 0.62 m[IU]/mL
HBV S Ab: NEGATIVE
HBV S Ag: NEGATIVE
Hep C Ab: NEGATIVE

## 2013-04-02 NOTE — Telephone Encounter (Signed)
A phone messages for results was initiated for this patient. It appears she returned the call with a vaginal discharge concerns and the initial result information was not relayed to the patient. Will contact patient with information regarding colposcopy results as it appears below.     Gloriann LoanFluellen, Kieryn Burtis J, NP at 03/17/2013 8:41 AM    Status: Signed               Colposcopy biopsy confirms a mild change (LSIL) to the cell of the cervix. Recommendations are for repeat pap/HPV in 1 year. Office will notify patient of results and plan. Pap/HPV can be collected with AGY next year.

## 2013-04-03 NOTE — Telephone Encounter (Signed)
Please call patient with pap results if warranted.

## 2013-04-06 ENCOUNTER — Telehealth: Payer: Self-pay

## 2013-04-06 NOTE — Telephone Encounter (Signed)
The patient is not pregnant.   The patient is requesting proof of flu shot she received on 10.22.14 to be faxed to her new employer at [phone] Attn: Lyndel PleasureLisa Musso.  Patient is requesting this to be faxed as soon as possible she is currently in orientation.

## 2013-04-06 NOTE — Telephone Encounter (Signed)
Reviewed Pap results with patient. She verbalized understanding.

## 2013-04-07 NOTE — Telephone Encounter (Signed)
Faxed proof of Flu "Immunization to [employer] @[phone]

## 2013-04-12 ENCOUNTER — Ambulatory Visit: Payer: Self-pay | Admitting: Obstetrics and Gynecology

## 2013-05-18 ENCOUNTER — Telehealth: Payer: Self-pay

## 2013-05-18 NOTE — Telephone Encounter (Signed)
Ms. Darlene Villegas calling to report that she is experiencing discharge that is white/thick, and has an abnormal smell to it. These symptoms have been going on for 4 day(s)    Patient requesting same day appointment? no  Patient requesting call back? yes    Phone number confirmed at [phone]

## 2013-05-18 NOTE — Telephone Encounter (Signed)
Pt having thick white vaginal discharge with odor for 4 days. Referenced Long-vulvovaginits. Pt scheduled for evaluation 05/19/13. States a good understanding and will keep appt.

## 2013-05-19 ENCOUNTER — Ambulatory Visit: Payer: Self-pay | Admitting: Reproductive Endocrinology and Infertility

## 2013-05-26 ENCOUNTER — Ambulatory Visit: Payer: Self-pay | Admitting: Reproductive Endocrinology and Infertility

## 2013-05-26 ENCOUNTER — Encounter: Payer: Self-pay | Admitting: Reproductive Endocrinology and Infertility

## 2013-05-26 ENCOUNTER — Telehealth: Payer: Self-pay

## 2013-05-26 VITALS — BP 155/75 | HR 83 | Ht 63.0 in | Wt 210.5 lb

## 2013-05-26 DIAGNOSIS — N760 Acute vaginitis: Secondary | ICD-10-CM

## 2013-05-26 DIAGNOSIS — B9689 Other specified bacterial agents as the cause of diseases classified elsewhere: Secondary | ICD-10-CM

## 2013-05-26 LAB — POCT VAGINAL WET MOUNT
CLUE CELLS,POC: POSITIVE
KOH FOR FUNGUS: NEGATIVE
TRICHOMONAS, POC: NEGATIVE
WHIFF TEST: POSITIVE
YEAST,POC: NEGATIVE

## 2013-05-26 MED ORDER — CLASSIC PRENATAL 28-0.8 MG PO TABS *I*
1.0000 | ORAL_TABLET | Freq: Every day | ORAL | Status: DC
Start: 2013-05-26 — End: 2016-02-07

## 2013-05-26 MED ORDER — METRONIDAZOLE 0.75 % VA GEL *I*
1.0000 | Freq: Every evening | VAGINAL | Status: AC
Start: 2013-05-26 — End: 2013-05-31

## 2013-05-26 NOTE — Progress Notes (Signed)
Darlene Villegas is a 28 y.o. female who presents for evaluation of an abnormal vaginal discharge and odor. Symptoms have been present for 2weeks. Vaginal symptoms: vaginal symptoms of perineal odor and vaginal discharge white, yellow and thin.  She denies burning, itching, painful perineum and painful intercourse.  Sexually transmitted infection risk: very low risk of STD exposure. Patient's last menstrual period was 05/04/2013 (exact date). Contraception: none, is not trying to get pregnant, nor trying to prevent pregnancy.  Would welcome a pregnancy if it happened.    Patient's medications, allergies, past medical, surgical, social and family histories were reviewed and updated as appropriate.    Review of Systems  GYN ROS:  --GU Sxs:            --Vag DC: Yes            --Dysuria: No            --Bleeding with sex: No  --Breast Sxs: None  --Endocrine Sxs.: None     Objective:     Filed Vitals:    05/26/13 1346   BP: 155/75   Pulse: 83   Height: 1.6 m (5\' 3" )   Weight: 95.482 kg (210 lb 8 oz)       General appearance: 28 y.o. female. alert, cooperative and no distress  PELVIC:   Ext Gen: Normal female  Vagina: pink, moderate amount of thin white adherent discharge  Cervix: no discharge, no CMT  Uterus: NSSC, NT  Adnexa: no palpable masses, non-tender    Recent Results (from the past 24 hour(s))   POCT VAGINAL WET MOUNT    Collection Time     05/26/13  2:13 PM       Result Value Range    TRICHOMONAS, POC Negative-none seen      YEAST,POC Negative-none seen      YEAST COMMENT,POC        CLUE CELLS,POC Positive moderate      LEUKOCYTES,POC        KOH FOR FUNGUS Negative-none seen      KOH FOR FUNGUS COMMENT        WHIFF TEST Positive        Assessment:   Bacterial vaginosis    PLAN:   Cervical DNA amplification for GC and Chlamydia were collected.  Will follow up with patient for any abnormal results.  She was given a prescription for MetroGel 0.75% one applicator  PV QHS x 5.  Perineal hygiene and vaginitis prevention  discussed.    Return to the office for unresolved or worsening symptoms.  AGY and pap smear due in October 2015.    Marguerita MerlesKELLY I Eli Pattillo, NP

## 2013-05-26 NOTE — Telephone Encounter (Signed)
Patient stated she's having a lot of discharge, odor & itching, she's hoping to be seen today anytime before 1:30pm today. Patient NOS her last appointment 2.20.15 due to work issues, patient's CP & Team had no availability and I checked the other teams no had anything but Dr. Lily PeerFernandez did but the patient didn't want to see a female provide. She can be reached @ [phone], thank you.

## 2013-05-26 NOTE — Addendum Note (Signed)
Addended by: Avanell ShackletonRUZ, Naydeen Speirs on: 05/26/2013 02:21 PM     Modules accepted: Orders

## 2013-05-26 NOTE — Telephone Encounter (Signed)
Patient reports having malodorous vaginal discharge and itching for over one week. She denies any pelvic pain or UTI symptoms. She has had unprotected intercourse with her partner of 4 years. She has accepted appointment with K Monachino for this afternoon. Compliance reinforced as patient has had multiple no showed appointments. Referenced Long/ vulvovaginitis.

## 2013-05-26 NOTE — Patient Instructions (Signed)
Metrogel                                                  Vaginal Antibacterials (Vaginal)  Treats vaginal infection caused by bacteria.   Brand Name(s):Vandazole , Trimo-San , Metrogel-Vaginal , Metrogel , MetroGel-Vaginal , Fem pH , Relagard , Avc , AVC , Vagi-Gard Douche Maximum Strength , Operand Povidone-Iodine Douche , Vagi-Gard Douche , Summer's Eve Douche , TopCare Feminine Douche  There may be other brand names for this medicine.  When This Medicine Should Not Be Used:  You should not use this medicine if you have had an allergic reaction to an antibacterial such as clindamycin (Cleocin®), lincomycin (Lincocin®), or metronidazole (MetroGel®). You should not use this medicine if you have ever had an intestinal disorder such as ulcerative colitis, or colitis from using antibiotics.   How to Use This Medicine:  Your doctor will tell you how much of this medicine to use and how often. Do not use more medicine or use it more often than your doctor tells you to.   This medicine comes with patient instructions. Read and follow these instructions carefully. Ask your doctor or pharmacist if you have any questions.   This medicine is to be used only in the vagina. Use at bedtime unless your doctor tells you otherwise.   Wash your hands with soap and water before and after using this medicine.   Avoid getting this medicine in your eyes. If it does get in your eyes, rinse them thoroughly with cool water.   Wash the applicator with warm, soapy water after you use it. If the medicine comes with disposable applicators, use each applicator only once, and then throw it away.  If a dose is missed:  If you miss a dose or forget to use your medicine, use it as soon as you can. If it is almost time for your next dose, wait until then to use the medicine and skip the missed dose. Do not use extra medicine to make up for a missed dose.  How to Store and Dispose of This Medicine:  Store the medicine in a closed container at room  temperature, away from heat, moisture, and direct light.   Ask your pharmacist, doctor, or health caregiver about the best way to dispose of the used medicine applicator(s), containers and any leftover medicine after you have finished your treatment. You will also need to throw away old medicine after the expiration date has passed.   Keep all medicine away from children and never share your medicine with anyone.  Drugs and Foods to Avoid:  Ask your doctor or pharmacist before using any other medicine, including over-the-counter medicines, vitamins, and herbal products.  Make sure your doctor knows if you are also using cimetidine (Tagamet®), disulfiram (Antabuse®), lithium, or a blood thinner such as warfarin (Coumadin®).   Tell your doctor if you are using medicine to treat a muscle disorder, such as ambenonium (Mytelase®), neostigmine (Prostigmin®), or pyridostigmine (Mestinon®).   Avoid using douches or other vaginal products unless your doctor tells you to.   Do not drink alcohol while you are using this medicine.  Warnings While Using This Medicine:  Make sure your doctor knows if you are pregnant or breast feeding. Tell your doctor if you have any nerve disorders, liver disease, or a history of alcohol   abuse.   Do not have sexual intercourse while you are using this medicine and for 3 days after your treatment ends.   If you have severe diarrhea, ask your doctor before taking any medicine to stop the diarrhea.   This medicine may leak out of your vagina during the day. You may wear a sanitary pad to protect your clothing, but do not use a tampon.   Keep using this medicine for the full time of treatment, even if your symptoms improve after the first few doses. Do not stop using the medicine if your menstrual period begins during your treatment time. Use sanitary pads rather than tampons.   Make sure any doctor or dentist who treats you knows that you are using this medicine. This medicine may affect the  results of certain medical tests.  Possible Side Effects While Using This Medicine:  Call your doctor right away if you notice any of these side effects:  Allergic reaction: Itching or hives, swelling in your face or hands, swelling or tingling in your mouth or throat, chest tightness, trouble breathing.   Diarrhea that may contain blood.   Menstrual-type pain.   Numbness, tingling, or burning pain in your hands, arms, legs, or feet.   Problems with urination.   Seizure.   Unusual bleeding.   Vaginal pain.  If you notice these less serious side effects, talk with your doctor:  Diarrhea or constipation.   Dry mouth.   Headache.   Mild skin rash or itching.   Mild vaginal burning or dryness.   Pain in your pelvic area.   Nausea, vomiting, stomach pain.   Vaginal itching or discharge.  If you notice other side effects that you think are caused by this medicine, tell your doctor.  Call your doctor for medical advice about side effects. You may report side effects to FDA at 1-800-FDA-1088

## 2013-05-29 LAB — CHLAMYDIA PLASMID DNA AMPLIFICATION: Chlamydia Plasmid DNA Amplification: 0

## 2013-05-29 LAB — N. GONORRHOEAE DNA AMPLIFICATION: N. gonorrhoeae DNA Amplification: 0

## 2013-06-30 ENCOUNTER — Ambulatory Visit: Payer: Self-pay | Admitting: Obstetrics and Gynecology

## 2013-07-05 ENCOUNTER — Ambulatory Visit
Admit: 2013-07-05 | Discharge: 2013-07-05 | Disposition: A | Discharge status: Home / Self Care | Payer: Self-pay | Source: Ambulatory Visit | Attending: Internal Medicine | Admitting: Internal Medicine

## 2013-07-06 LAB — VARICELLA ZOSTER IGG AB: VZV IgG: POSITIVE

## 2013-07-06 LAB — HEPATITIS B SURFACE ANTIBODY
HBV S Ab Quant: 0.52 m[IU]/mL
HBV S Ab: NEGATIVE

## 2013-07-07 ENCOUNTER — Ambulatory Visit: Payer: Self-pay | Admitting: Obstetrics and Gynecology

## 2013-12-29 ENCOUNTER — Encounter: Payer: Self-pay | Admitting: Obstetrics and Gynecology

## 2013-12-29 ENCOUNTER — Ambulatory Visit: Payer: Self-pay | Admitting: Obstetrics and Gynecology

## 2013-12-29 VITALS — BP 120/80 | HR 84 | Ht 62.0 in | Wt 211.0 lb

## 2013-12-29 DIAGNOSIS — N898 Other specified noninflammatory disorders of vagina: Secondary | ICD-10-CM

## 2013-12-29 NOTE — Progress Notes (Signed)
Subjective:      Darlene Villegas is a 28 y.o. female who presents for evaluation of vaginal discharge with odor for past 2 weeks. Sexual history reviewed with the patient. STI Exposure: denies knowledge of risky exposure. Previous history of STI GC, chlamydia. Current symptoms vaginal discharge: white and malodorous.  Contraception: none; is attempting pregnancy.  Using sensitive skin body wash.  No douching, but occasionally does wash vagina internally.  Also states that she previously was working with Dr. Geradine GirtGellasch a few years ago for infertility concerns and was going to try clomid, but then never got around to doing this.  Would like to meet with someone again to facilitate this process.    Menstrual History:  OB History     Gravida Para Term Preterm AB TAB SAB Ectopic Multiple Living    2    2   2   0         Patient's last menstrual period was 12/29/2013.       Patient's medications, allergies, past medical, surgical, social and family histories were reviewed and updated as appropriate.    Review of Systems  Pertinent items are noted in HPI.      Objective:      BP 120/80 mmHg   Pulse 84   Ht 1.575 m (5\' 2" )   Wt 95.709 kg (211 lb)   BMI 38.58 kg/m2   LMP 12/29/2013  General:   alert, appears stated age and oriented x3   Lymph Nodes:   no inguinal adenopathy   Pelvis:  External genitalia: normal general appearance  Vaginal: normal rugae and discharge, bloody  Cervix: normal appearance  Adnexa: non palpable  Uterus: normal single, nontender   Cultures:  vaginitis screen (trich, gardnerella, yeast)     wet prep- negative for pathogens; RBC's too numerous to count  Assessment:       normal physiologic discharge  History of infertility        Plan:      See orders for STD cultures and assays  Will call pt with results if abnormal  Reviewed vaginitis prevention measures  Safe sex practices reviewed  RTC 4 weeks with MD for infertility work up

## 2013-12-29 NOTE — Addendum Note (Signed)
Addended by: Myrtie SomanKINSLER, Kamrie Fanton NICOLE on: 12/29/2013 02:41 PM     Modules accepted: Orders

## 2013-12-30 LAB — VAGINITIS SCREEN: DNA PROBE: Vaginitis Screen:DNA Probe: POSITIVE

## 2014-01-02 ENCOUNTER — Telehealth: Payer: Self-pay | Admitting: Obstetrics and Gynecology

## 2014-01-02 DIAGNOSIS — B9689 Other specified bacterial agents as the cause of diseases classified elsewhere: Secondary | ICD-10-CM

## 2014-01-02 DIAGNOSIS — N760 Acute vaginitis: Secondary | ICD-10-CM

## 2014-01-02 MED ORDER — METRONIDAZOLE 500 MG PO TABS *I*
500.0000 mg | ORAL_TABLET | Freq: Two times a day (BID) | ORAL | Status: AC
Start: 2014-01-02 — End: 2014-01-09

## 2014-01-02 NOTE — Telephone Encounter (Signed)
Spoke with patient and let her know of her positive gardnerella screen. Let patient know that the Flagyl prescription was called to her pharmacy and that she was to avoid alcohol while taking the medication. Patient verbalized understanding.

## 2014-01-02 NOTE — Telephone Encounter (Signed)
Will allow patient time to return call.

## 2014-01-02 NOTE — Telephone Encounter (Signed)
Patient saw D. Pittinaro, NP on 12/29/13.  The vaginitis screen is positive for gardnerella.  Left message to call phone nurse.  Please inform the patient I sent an RX for Flagyl 500 mg po bid x 7d, no ETOH to her pharmacy.

## 2014-01-02 NOTE — Telephone Encounter (Signed)
Patient returning call. She will keep phone line open.

## 2014-01-12 ENCOUNTER — Telehealth: Payer: Self-pay

## 2014-01-12 NOTE — Telephone Encounter (Signed)
Spoke with patient. She says 2 days ago she went to take her Flagyl that she was prescribed for +Gardnerella and the last 2 pills fell down the drain. That would have been her last day of the medication. Patient states she forgot and then realized today she should call and check to see what she should do. She is currently not having any symptoms. Will route to acute provider for advisement.

## 2014-01-12 NOTE — Telephone Encounter (Signed)
Spoke with patient. Let her know that she should watch for any recurrent symptoms and call the office for follow up if needed. Patient verbalized understanding.

## 2014-01-12 NOTE — Telephone Encounter (Signed)
Left message for patient to return call to [facility] at [phone].

## 2014-01-12 NOTE — Telephone Encounter (Signed)
She should monitor for recurrent symptoms and follow up in the office as needed.

## 2014-01-12 NOTE — Telephone Encounter (Signed)
Patient is calling because she dropped 2 of her Rx pills down the drain(metroNIDAZOLE ). She is wondering whether or not she needs a refill or if she should wait for symptoms to reappear. The Rx that was ordered says it was a vaginal gel but patient states it was given to her in pill form.

## 2014-01-15 ENCOUNTER — Telehealth: Payer: Self-pay

## 2014-01-15 DIAGNOSIS — B9689 Other specified bacterial agents as the cause of diseases classified elsewhere: Secondary | ICD-10-CM

## 2014-01-15 DIAGNOSIS — N760 Acute vaginitis: Secondary | ICD-10-CM

## 2014-01-15 MED ORDER — METRONIDAZOLE 500 MG PO TABS *I*
500.0000 mg | ORAL_TABLET | Freq: Two times a day (BID) | ORAL | Status: AC
Start: 2014-01-15 — End: 2014-01-22

## 2014-01-15 NOTE — Telephone Encounter (Signed)
Prescription for flagyl 500 mg po bid x 7 days escribed to the listed pharmacy.

## 2014-01-15 NOTE — Telephone Encounter (Signed)
Pt was diagnosed with + gardnerella on 12/29/13. Given script for Flagyl 500 mg bid x 7 days. Lost her last 2 pills down the drain. Now started having thick vaginal discharge with odor. States she frequently gets BV. Was advised to call back if her discharge returned to see if provider would order more medication. Will forward to [place] NP to review/advise.

## 2014-01-15 NOTE — Telephone Encounter (Signed)
Spoke with patient to notify her that provider called her in another course of treatment. Pt to call back if symptoms persist after completing this course of treatment. Pt states a good understanding.

## 2014-01-15 NOTE — Telephone Encounter (Signed)
Patient called back with update stated since she's been monitoring her symptoms she is starting to see the thick white discharge again (noticed yesterday morning - 01/14/14).  Patient is asking to please be called back @ [phone] .     See previous message from 01/12/14

## 2014-01-26 ENCOUNTER — Ambulatory Visit: Payer: Self-pay | Admitting: Obstetrics and Gynecology

## 2014-02-09 ENCOUNTER — Encounter: Payer: Self-pay | Admitting: Obstetrics & Gynecology

## 2014-02-09 ENCOUNTER — Ambulatory Visit: Payer: Self-pay | Admitting: Obstetrics & Gynecology

## 2014-02-09 VITALS — BP 119/71 | HR 74 | Ht 63.0 in | Wt 213.0 lb

## 2014-02-09 DIAGNOSIS — Z8742 Personal history of other diseases of the female genital tract: Secondary | ICD-10-CM

## 2014-02-09 LAB — T4, FREE: Free T4: 1.1 ng/dL (ref 0.9–1.7)

## 2014-02-09 LAB — PROLACTIN: Prolactin: 14.1 ng/mL

## 2014-02-09 LAB — TSH: TSH: 1.13 u[IU]/mL (ref 0.27–4.20)

## 2014-02-09 NOTE — Student Note (Addendum)
Medical Student OB/GYN History & Physical  02/09/2014 CC3 H&P  11:35 AM     CC: follow up LGSIL and difficulty conceiving        HPI   Darlene Villegas is a 28 y.o. 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 In 2009 she had a R ectopic pregnancy that resulted in a R salpingoopherectomy. She continued to try to conceive and in 2012 she had an ectopic pregnancy on the L that was treated with methotrexate. She had a hysterosalpingogram showing patent L fallopian tube and followed up with Dr. Shade Flood who planned to start her on clomid. She did not start the clomid because her partner was incarcerated and she began school. In the past 6 months, they have been actively trying to conceive and she has been keeping a record of her cycles. She is having sex every other day and is using no form of contraception. She is having regular periods every 26-30 days with 4-6 days of moderate bleeding and mild cramps. Her LMP was 10/31. Her partner, Mliss Fritz is 27 y/o, has 5 prior children with the oldest being 9y/o, and is a nonsmoker. She denies vaginal discharge, dysuria, or pelvic pain.           OB/GYN HISTORY    Menarche at age 47, menses every 26-30 days and lasts 4-6 days   Prior births: 0    Miscarriages: 2 (both ectopic)   Abortions: 0  Abnormal pap smears:         2008 pap - negative        02/28/08:  pap ASCUS, + HPV        01/18/2013: pap LGSIL (mild dysplasia)        03/14/2014: colposcopy LSIL (CIN1)   History of STDs:             Chlamydia x 2       PAST MEDICAL HISTORY    Medical problems: Caries, impacted 3rd molar, sickle cell trait    Surgeries: 07/2007 R salpingoopherectomy.         MEDICATIONS/ALLERGIES     Current Outpatient Prescriptions   Medication Sig    prenatal multivitamin (PRENAVITE) tablet Take 1 tablet by mouth daily    diphenhydrAMINE (BENADRYL) 25 MG tablet Take 25 mg by mouth every 6 hours as needed        Loratadine (CLARITIN PO) Take  by  mouth.    ibuprofen (ADVIL,MOTRIN) 600 MG tablet Take 1 tablet (600 mg total) by mouth 3 times daily as needed       Nickel allergy        FAMILY HISTORY    None       SOCIAL HISTORY   Former smoker (quit 2014). Drinks alcohol occasionally, once a week. 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 Sexually active with the same partner for past 3 years. Feels safe and reports no concerns for domestic violence.        VITALS   BP 119/71 mmHg   Pulse 74   Ht 1.6 m (_0 )   Wt 96.616 kg (213 lb)   BMI 37.74 kg/m2   LMP 01/27/2014 (Exact Date)        PHYSICAL EXAM, LABS & IMAGING   Physical Exam   General appearance: Sitting comfortably and in no apparent distress.    Lungs: Normal respiratory efford   Abdomen: Soft, nondistended, and nontender. Gravid, fundus, fundal height   Pelvic:  Normal appearing external genitalia, vagina, and cervix. No lesions appreciated. Upper limits of normal uterus with normal contours. No cervical motion tenderness or adnexal tenderness. Vaginal discharge clear and thin.   Extremities: No clubbing, cyanosis, edema.          ASSESSMENT & PLAN   Summary: Darlene Villegas is a 28 y.o. 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 Most likely etiology for difficulty conceiving is scarring from ectopic pregnancy and prior chlamydial infections. Less likely is anovulatory cycles given she is having regular cycles.      Will check TSH and prolactin levels   GC/CT screen   Pap smear today as 86monthfollow up with cytology is recommended by [facility] for CIN1 colposcopy in 02/2013.   Spoke to patient about having partner follow up with PCP for semen analysis. Also encouraged exercising and eating healthy to lower BMI which would increase fertility.    Ovulation prediction kit    F/U in 6 months if still unable to conceive. Will consider further secondary infertility evaluation at that time.      [facility]net[facility]CN[facility] 02/09/2014 11:35 AM  This is a  medical student note. Please see resident/attending's note for final assessment and plan.

## 2014-02-09 NOTE — Progress Notes (Signed)
[facility]  GYN Follow-up Visit     CHIEF COMPLAINT: f/u for abnormal pap smear and infertility    HISTORY OF PRESENT ILLNESS  Darlene Villegas is a 28 y.o. 492P0020 female who presents to clinic today to follow-up with abnormal pap smear and infertililty. She has been trying to conceive intermittently for the last 3 years. Prolactin, TSH, and HSG were normal in 2013 and she was going to try clomid however her partner went to jail and so she never started.  She only has 1 fallopian tube and 1 ovary after RSO in 2009 for ectopic. She has been keeping a menstrual calendar for the last 6 months ( with regular cycles 26-30 days) and having intercourse every other day. Her two ectopic pregnancies were not with her current partner. Her current partner is 29 years old and has had 5 children previously ( youngest is 29 years old). She takes a prenatal vitamin most days.    She also has a history of LSIL pap in 2014. Colposcopy showed CIN I.     PMH, PSH, SH, FH, Medications & Allergies reviewed and updated appropriately.      PHYSICAL EXAM  Filed Vitals:    02/09/14 0957   BP: 119/71   Pulse: 74   Height: 1.6 m (5\' 3" )   Weight: 96.616 kg (213 lb)       General - alert, well appearing, and in no distress and Pleasant, well-appearing female, in NAD  Resp - clear to auscultation bilaterally and Normal respiratory effort  CV - normal rate, regular rhythm, normal S1, S2, no murmurs, rubs, clicks or gallops, Normal rate, normal rhythm  Abdomen - soft, nondistended, nontender  Extremities - No edema, no tenderness.    ASSESSMENT/PLAN  28 y.o. African American woman with history of LSIL pap and actively trying to concieve    Patient Active Problem List    Diagnosis Date Noted    Abnormal Pap smear of cervix 03/14/2013     Colpo 03/14/13 cervix Bx LSIL needs repeat pap/HPV in 1 year.  12/2012 - LSIL  2009 - ASCUS, + HPV      Smoking trying to quit 03/14/2013    Impacted third molar tooth 12/16/2012    Caries 12/13/2012      Pap and GC/CT collected today    Discussed diagnosis of infertility after actively trying for 1 year. Will recheck TFTs and prolactin today. Also recommended ovulation kits to time intercourse more effectively. Discussed semen analysis.  Also discussed elevated BMI and weight loss with diet and exercise. She was doing P90X but has since stopped and started eating fast foods which have increased her weight. She will focus on weight loss.       Return to clinic in 6 months if no conception at that time.     Scot DockSheila Aleea Hendry DO  OB/GYN R2  Pager [phone]

## 2014-02-12 LAB — CHLAMYDIA PLASMID DNA AMPLIFICATION: Chlamydia Plasmid DNA Amplification: 0

## 2014-02-12 LAB — N. GONORRHOEAE DNA AMPLIFICATION: N. gonorrhoeae DNA Amplification: 0

## 2014-02-15 LAB — GYN CYTOLOGY

## 2014-02-16 ENCOUNTER — Telehealth: Payer: Self-pay

## 2014-02-16 NOTE — Telephone Encounter (Signed)
-----   Message from Scot DockSheila Flaum, DO sent at 02/15/2014  5:11 PM EST -----  Please call patient. Her Pap was LSIL ( high grade lesion can not be ruled out) and she will need colposcopy. I will send a message to [facility]    Scot DockSheila Flaum DO  OB/GYN R2  Pager [phone]

## 2014-02-19 NOTE — Telephone Encounter (Signed)
Attempted to reach patient at her work number, no answer. Will send letter asking her to call office to discuss test results. Will forward to C. Coniglio LPN to schedule appt in colposcopy clinic per Dr. Richrd PrimeFlaum.

## 2014-02-19 NOTE — Telephone Encounter (Signed)
?   2nd notice- pt may be scheduled for colposcopy 4-6 weeks

## 2014-02-19 NOTE — Telephone Encounter (Signed)
Recording states the home number is not a working number.

## 2014-02-19 NOTE — Telephone Encounter (Signed)
[facility]

## 2014-02-21 NOTE — Telephone Encounter (Signed)
Per Clayborn HeronLinda Villegas- pt was notified of pap results and need for colposcopy in letter sent earlier this week. Colposcopy appointment may now be scheduled and letter with information sent if unable to reach pt by telephone

## 2014-02-21 NOTE — Telephone Encounter (Signed)
Appointment for Colpo/LGSIL scheduled for Wed. 04-18-14 at 3:15 PM, letter sent.

## 2014-04-16 ENCOUNTER — Telehealth: Payer: Self-pay

## 2014-04-16 NOTE — Telephone Encounter (Signed)
LVM at [phone] for patient to call [facility].  Colpo appointment rescheduled to Wed. 05-30-14 at 2:15 PM, letter sent.

## 2014-04-16 NOTE — Telephone Encounter (Signed)
Darlene Villegas is scheduled for a Colposcopy on 04/18/2014 and is unable to keep the appointment. Please call her back at [phone] to reschedule.

## 2014-04-16 NOTE — Telephone Encounter (Signed)
May be scheduled for next available colpo

## 2014-04-18 ENCOUNTER — Other Ambulatory Visit: Payer: Self-pay

## 2014-05-30 ENCOUNTER — Other Ambulatory Visit: Payer: Self-pay

## 2014-07-24 ENCOUNTER — Telehealth: Payer: Self-pay

## 2014-07-24 NOTE — Telephone Encounter (Signed)
Vaginitis    Are you pregnant:no    When did the symptoms start: 1 weeks ago.    Are you having the following symptoms:    Vaginal discharge: yes   Foul smelling odor: yes   Vaginal itching/irriation: no   Burning with urination: no    Patient needs an appt around 10:00-10:30 in the morning because she works nights.

## 2014-07-24 NOTE — Telephone Encounter (Signed)
Call returned to pt.  Calling with c/o thick white vaginal discharge with odor and irritation.  0 pain.No dysuria.  Referenced [facility],Vaginal Discharge,appt has been scheduled for 07/25/2014,[facility].  Compliance reinforced,pt voiced understanding.

## 2014-07-25 ENCOUNTER — Encounter: Payer: Self-pay | Admitting: Obstetrics and Gynecology

## 2014-07-25 ENCOUNTER — Ambulatory Visit: Payer: Self-pay | Admitting: Obstetrics and Gynecology

## 2014-07-25 VITALS — BP 143/60 | HR 71 | Ht 63.0 in | Wt 208.2 lb

## 2014-07-25 DIAGNOSIS — A599 Trichomoniasis, unspecified: Secondary | ICD-10-CM

## 2014-07-25 DIAGNOSIS — N898 Other specified noninflammatory disorders of vagina: Secondary | ICD-10-CM

## 2014-07-25 LAB — POCT VAGINAL WET MOUNT
CLUE CELLS,POC: POSITIVE
KOH FOR FUNGUS: NEGATIVE
TRICHOMONAS, POC: POSITIVE
WHIFF TEST: POSITIVE
YEAST,POC: NEGATIVE

## 2014-07-25 MED ORDER — METRONIDAZOLE 500 MG PO TABS *I*
2000.0000 mg | ORAL_TABLET | Freq: Once | ORAL | Status: DC
Start: 2014-07-25 — End: 2014-07-25

## 2014-07-25 MED ORDER — METRONIDAZOLE 500 MG PO TABS *I*
2000.0000 mg | ORAL_TABLET | Freq: Once | ORAL | Status: AC
Start: 2014-07-25 — End: 2014-07-25

## 2014-07-25 NOTE — Patient Instructions (Signed)
Trichomoniasis    The Basics   Written by the doctors and editors at UpToDate   What is trichomoniasis?--Trichomoniasis is an infection that you can catch during sex. Infections that you can catch during sex are called sexually transmitted infections.  Both women and men can get trichomoniasis.   What are the symptoms of trichomoniasis?--Some people with trichomoniasis have no symptoms. This is true for most men and some women.   In women, the symptoms of the infection include one or more of the following:  ?Vaginal discharge that smells bad and might be foamy and greenish-yellow (Vaginal discharge is the medical term to describe the small amount of fluid that comes out of the vagina.)   ?Itching of the vagina or the area around the vagina  ?Burning or pain during urination   ?Pain during sex  In men, the symptoms of the infection include:   ?Discharge from the penis  ?Burning or pain during urination   Should I see a doctor or nurse?--Yes, you should see your doctor or nurse if you have any of the symptoms listed above. You should also see a doctor or nurse if any of your sexual partners have had trichomoniasis within the past few months. Even if you have no symptoms, you could be infected.   Is there a test for trichomoniasis?--Yes. In women, the test involves doing a vaginal exam and taking a sample of vaginal discharge. In men, the doctor might test a sample of urine or discharge from the penis.  Your doctor might want to test you for other sexually transmitted infections, too.   How is trichomoniasis treated?--Trichomoniasis is treated with medicine that comes in pills. There are 2 prescription medicines for trichomoniasis:  ?Metronidazole (sample brand name: Flagyl)  ?Tinidazole (brand name: Tindamax)  If you learn that you have trichomoniasis, you should contact all the people you have had sex with recently to let them know. Thats because they might have trichomoniasis, too. If they have  trichomoniasis, they will need treatment even if they have no symptoms. If you are infected, anyone you have sex with should be treated at the same time. Otherwise, your sexual partner could infect you again after you are treated.   While you are getting treated for trichomoniasis, you should not have sex with anyone. Wait until you have taken all your medicine and have no more symptoms.   What happens if I dont get treated?--If you dont get treatment for your trichomoniasis, your symptoms can get worse. Plus, you can keep spreading the infection to your sexual partners. You might also have a higher chance of getting the virus that causes AIDS.   Can trichomoniasis be prevented?--You can lower your chances of getting trichomoniasis if you:  ?Use a condom every time you have sex.  ?Avoid sex when you or your partner has any symptoms that could be caused by an infection. These include itching, discharge, or pain with urination.  All topics are updated as new evidence becomes available and our peer review process is complete.  This topic retrieved from [employer] on: Jul 19, 2013.  Topic [phone] Version 3.0  Release: 23.3 - C23.74  2015[employer], Inc.All rights reserved.  Consumer Information Use and Disclaimer   This information is not specific medical advice and does not replace information you receive from your health care provider. This is only a brief summary of general information. It does NOT include all information about conditions, illnesses, injuries, tests, procedures, treatments, therapies, discharge instructions or  life-style choices that may apply to you. You must talk with your health care provider for complete information about your health and treatment options. This information should not be used to decide whether or not to accept your health care provider's advice, instructions or recommendations. Only your health care provider has the knowledge and training to provide advice that is right for  you.The use of UpToDate content is governed by the UpToDate Terms of Use. ©2015 [employer] Inc. All rights reserved.  Copyright   © 2015 [employer], [employer] All rights reserved.        Metronidazole (Systemic) (met roe NYE da zole)    Brand Names: [place] Flagyl; Flagyl ER; Metro   Brand Names: [place] Flagyl; Metronidazole Injection USP; Novo-Nidazol; PMS-Metronidazole   Warning   · Metronidazole has been shown to cause cancer in mice and rats with long-term use. Talk with the doctor.  · Do not use this drug for other health problems.  What is this drug used for?   · It is used to treat infections.  · It is used to prevent infections during bowel surgery.  · It may be given to you for other reasons. Talk with the doctor.  What do I need to tell my doctor BEFORE I take this drug?   · If you have an allergy to metronidazole or any other part of this drug.  · If you are allergic to any drugs like this one, any other drugs, foods, or other substances. Tell your doctor about the allergy and what signs you had, like rash; hives; itching; shortness of breath; wheezing; cough; swelling of face, lips, tongue, or throat; or any other signs.  · If you are less than [redacted] weeks pregnant. This drug is not for use in certain patients who are less than [redacted] weeks pregnant.  · If you have taken disulfiram within the past 2 weeks.  · This is not a list of all drugs or health problems that interact with this drug.  · Tell your doctor and pharmacist about all of your drugs (prescription or OTC, natural products, vitamins) and health problems. You must check to make sure that it is safe for you to take this drug with all of your drugs and health problems. Do not start, stop, or change the dose of any drug without checking with your doctor.  What are some things I need to know or do while I take this drug?   · Tell dentists, surgeons, and other doctors that you use this drug.  · Have your blood work checked. Talk with your doctor.  · This drug may  affect certain lab tests. Be sure your doctor and lab workers know you take this drug.  · Avoid drinking alcohol. Avoid alcohol for at least 72 hours after the last dose. Drinking alcohol or taking products that have alcohol, such as cough syrup, may cause cramps, upset stomach, headaches, and flushing.  · Do not use longer than you have been told. A second infection may happen.  · If you are 65 or older, use this drug with care. You could have more side effects.  · Tell your doctor if you are pregnant or plan on getting pregnant. You will need to talk about the benefits and risks of using this drug while you are pregnant.  · Tell your doctor if you are breast-feeding. You will need to talk about any risks to your baby.  What are some side effects that I need to call my doctor about   right away?   · WARNING/CAUTION: Even though it may be rare, some people may have very bad and sometimes deadly side effects when taking a drug. Tell your doctor or get medical help right away if you have any of the following signs or symptoms that may be related to a very bad side effect:  · All products:  · Signs of an allergic reaction, like rash; hives; itching; red, swollen, blistered, or peeling skin with or without fever; wheezing; tightness in the chest or throat; trouble breathing or talking; unusual hoarseness; or swelling of the mouth, face, lips, tongue, or throat.  · Change in balance.  · Trouble speaking.  · Seizures.  · Redness or white patches in mouth or throat.  · Some people who took this drug for a long time have had nerve problems that lasted for a long time. Call your doctor right away if you have a burning, numbness, or tingling feeling that is not normal.  · This drug may raise the chance of a very bad brain problem called aseptic meningitis. Call your doctor right away if you have a headache, fever, chills, very upset stomach or throwing up, stiff neck, rash, bright lights bother your eyes, feeling sleepy, or  change in thinking clearly and with logic.  · Shot:  · Redness or swelling where the shot is given.  What are some other side effects of this drug?   · All drugs may cause side effects. However, many people have no side effects or only have minor side effects. Call your doctor or get medical help if any of these side effects or any other side effects bother you or do not go away:  · All products:  · Upset stomach or throwing up.  · Loose stools (diarrhea).  · Hard stools (constipation).  · Headache.  · Not hungry.  · Metallic taste.  · Shot:  · Irritation where the shot is given.  · These are not all of the side effects that may occur. If you have questions about side effects, call your doctor. Call your doctor for medical advice about side effects.  · You may report side effects to your national health agency.  How is this drug best taken?   · Use this drug as ordered by your doctor. Read all information given to you. Follow all instructions closely.  · All products:  · To gain the most benefit, do not miss doses.  · Take as you have been told, even if you feel well.  · Short-acting products:  · Take with or without food. Take with food if it causes an upset stomach.  · Long-acting products:  · Take on an empty stomach. Take 1 hour before or 2 hours after meals.  · Swallow whole. Do not chew, break, or crush.  · Shot:  · It is given as a shot into a vein over a period of time.  · Your doctor may teach you how to use.  · Follow how to use carefully.  · Do not use if the solution is cloudy, leaking, or has particles.  · Do not use if solution changes color.  · Throw away needles in a needle/sharp disposal box. Do not reuse needles or other items. When the box is full, follow all local rules for getting rid of it. Talk with a doctor or pharmacist if you have any questions.  What do I do if I miss a dose?   · Take a missed   dose as soon as you think about it.  · If it is close to the time for your next dose, skip the  missed dose and go back to your normal time.  · Do not take 2 doses at the same time or extra doses.  How do I store and/or throw out this drug?   · All oral products:  · Store at room temperature.  · Store in a dry place. Do not store in a bathroom.  · Shot:  · Most of the time, this drug will be given in a hospital or doctor's office. If stored at home, follow how to store as you were told by the doctor.  · All products:  · Keep all drugs out of the reach of children and pets.  · Check with your pharmacist about how to throw out unused drugs.  General drug facts   · If your symptoms or health problems do not get better or if they become worse, call your doctor.  · Do not share your drugs with others and do not take anyone else's drugs.  · Keep a list of all your drugs (prescription, natural products, vitamins, OTC) with you. Give this list to your doctor.  · Talk with the doctor before starting any new drug, including prescription or OTC, natural products, or vitamins.  · Some drugs may have another patient information leaflet. If you have any questions about this drug, please talk with your doctor, pharmacist, or other health care provider.  · If you think there has been an overdose, call your poison control center or get medical care right away. Be ready to tell or show what was taken, how much, and when it happened.  Consumer Information Use and Disclaimer   · This information should not be used to decide whether or not to take this medicine or any other medicine. Only the healthcare provider has the knowledge and training to decide which medicines are right for a specific patient. This information does not endorse any medicine as safe, effective, or approved for treating any patient or health condition. This is only a brief summary of general information about this medicine. It does NOT include all information about the possible uses, directions, warnings, precautions, interactions, adverse effects, or risks  that may apply to this medicine. This information is not specific medical advice and does not replace information you receive from the healthcare provider. You must talk with the healthcare provider for complete information about the risks and benefits of using this medicine.  Last Reviewed Date   2012-06-01  Copyright   · © 2015 [employer] and its affiliates and/or licensors. All rights reserved.

## 2014-07-25 NOTE — Telephone Encounter (Signed)
RX sent

## 2014-07-25 NOTE — Addendum Note (Signed)
Addended by: Thornton PapasOSARIO-MCCABE, Breyah Akhter A on: 07/25/2014 04:26 PM     Modules accepted: Orders

## 2014-07-25 NOTE — Progress Notes (Signed)
Subjective:       Darlene Villegas is a 29 y.o. female who presents for evaluation of an abnormal vaginal discharge. Symptoms have been present for several weeks. Vaginal symptoms: discharge described as grey and odor. She reports frequent BV.  She has been with her current partner for 5 years and does not feel at risk for STDs.  Reports negative HIV test 2 months ago.  She has not used OTC medications for her symptoms.      Patient's medications, allergies, past medical, surgical, social and family histories were reviewed and updated as appropriate.      Review of Systems  Pertinent items are noted in HPI.      Objective:      BP 143/60 mmHg   Pulse 71   Ht 1.6 m (5\' 3" )   Wt 94.439 kg (208 lb 3.2 oz)   BMI 36.89 kg/m2   LMP 07/09/2014 (Exact Date)  General appearance: alert and no distress  Abdomen:  Soft, NT  Pelvic         Ext gen:  No masses,NT         Int gen:  Negative BSU, pink, rugated with frothy grey malodorous discharge         Cervix: Mid, pink, os closed without discharge.             Recent Results (from the past 24 hour(s))   POCT vaginal wet mount    Collection Time: 07/25/14 11:59 AM   Result Value Ref Range    TRICHOMONAS, POC Positive many     YEAST,POC Negative-none seen     YEAST COMMENT,POC      CLUE CELLS,POC Positive many     LEUKOCYTES,POC      KOH FOR FUNGUS Negative-none seen     KOH FOR FUNGUS COMMENT      WHIFF TEST Positive            Assessment:      Bacterial vaginosis and Trichomonas vaginalis.      Plan:     GC and CT sent  Symptomatic local care discussed.  Transport plannerducational materials distributed.  Encouraged partner notification, treatment, and safer sex practices.  Oral antibiotics see orders.  RTO 1 month for test of cure  Patient able to teach back information given to her today.

## 2014-07-25 NOTE — Telephone Encounter (Signed)
Will route to L. Rosario-McCabe NP to resend to [facility]. ( see below )

## 2014-07-25 NOTE — Telephone Encounter (Signed)
Patient called stated script for  metroNIDAZOLE (FLAGYL) 500 MG [facility] and they no longer work with her insurance so script needs to be sent to [facility] on [address], [place], [place] [id number].  Patient can be reached @ [phone]

## 2014-07-26 LAB — N. GONORRHOEAE DNA AMPLIFICATION: N. gonorrhoeae DNA Amplification: 0

## 2014-07-26 LAB — CHLAMYDIA PLASMID DNA AMPLIFICATION: Chlamydia Plasmid DNA Amplification: 0

## 2014-08-20 ENCOUNTER — Ambulatory Visit: Payer: Self-pay | Admitting: Obstetrics & Gynecology

## 2014-08-24 ENCOUNTER — Ambulatory Visit: Payer: Self-pay | Admitting: Obstetrics & Gynecology

## 2014-08-28 ENCOUNTER — Ambulatory Visit: Payer: Self-pay | Admitting: Obstetrics & Gynecology

## 2014-09-24 ENCOUNTER — Ambulatory Visit: Payer: Self-pay | Admitting: Obstetrics & Gynecology

## 2014-09-25 ENCOUNTER — Telehealth: Payer: Self-pay | Admitting: Obstetrics & Gynecology

## 2014-09-25 NOTE — Telephone Encounter (Signed)
Discussed LSIL pap (11/15) after ASC pap (2014) and subsequent colpo. Discussed need for repeat colposcopy. Patient is scheduled for whp appt 7/19 but discussed patient needs appt in colposcopy clinic. Awaiting call from C Coniglio.    Scot DockSheila Muhammadali Ries DO  OB/GYN R3  Pager [phone]

## 2014-09-26 NOTE — Telephone Encounter (Signed)
Pt may be scheduled for next available colposcopy

## 2014-09-26 NOTE — Telephone Encounter (Signed)
LVM at [phone] for patient to call [facility].  Appointment for Colpo/LGSIL scheduled for Wed. 10/17/14 at 2:30 PM, letter sent.

## 2014-10-16 ENCOUNTER — Ambulatory Visit: Payer: Self-pay | Admitting: Obstetrics & Gynecology

## 2014-10-16 ENCOUNTER — Encounter: Payer: Self-pay | Admitting: Obstetrics & Gynecology

## 2014-10-16 VITALS — BP 120/72 | HR 71 | Ht 63.0 in | Wt 209.8 lb

## 2014-10-16 DIAGNOSIS — Z3169 Encounter for other general counseling and advice on procreation: Secondary | ICD-10-CM

## 2014-10-16 DIAGNOSIS — Z8759 Personal history of other complications of pregnancy, childbirth and the puerperium: Secondary | ICD-10-CM

## 2014-10-16 DIAGNOSIS — N979 Female infertility, unspecified: Secondary | ICD-10-CM

## 2014-10-16 DIAGNOSIS — Z8619 Personal history of other infectious and parasitic diseases: Secondary | ICD-10-CM

## 2014-10-16 NOTE — Progress Notes (Signed)
[facility] - Problem Visit    CC:  Infertility     HPI: 29 y.o. 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 HSG on 01/21/11 showed a patent left tube.  She has been trying to conceive intermittently for the last three years. Prolactin, TSH normal 02/09/14.     LMP may have stated this last Sat, but patient states it is different. It is light and very dark. Usually her periods are28 to 31. She has been using the ovulation prediction kits for the last four months.  She says that they are very difficult to read and she suspects that she is not ovulating.  She has been having intercourse every other day. Patient states that she started a cycle of clomid, but never finished it because her partner went to jail.     The patient's past medical, surgical, social and family history, medications and allergies were updated as appropriate.     Current Outpatient Prescriptions:     diphenhydrAMINE (BENADRYL) 25 MG tablet, Take 25 mg by mouth every 6 hours as needed    , Disp: , Rfl:     Loratadine (CLARITIN PO), Take  by mouth., Disp: , Rfl:     prenatal multivitamin (PRENAVITE) tablet, Take 1 tablet by mouth daily, Disp: 30 tablet, Rfl: 11    ibuprofen (ADVIL,MOTRIN) 600 MG tablet, Take 1 tablet (600 mg total) by mouth 3 times daily as needed, Disp: 50 tablet, Rfl: 5      OBJECTIVE:   Vitals:    10/16/14 1053   BP: 120/72   Pulse: 71   Weight: 95.2 kg (209 lb 12.8 oz)   Height: 1.6 m (5\' 3" )     GEN: She appears well, afebrile.    ASSESSMENT and PLAN:   29 y.o. 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 She would like a referral to [facility] and states that she would be able to afford the consultation fee.     Infertility  - possible tubal factor vs ovulatory dysfunction  - Normal  TSH and prolactin 02/09/14  - Obtain Day 3 FSH and estradiol, day 21 progesterone  - Referral to [facility] placed.    - Encouraged patient to continue prenatals, timed intercourse, and ovulation predictors kits.     D/w Dr. Rudean CurtHarrington    Delaina Fetsch, MD   OB/GYN [phone]  10/16/14 11:09 AM

## 2014-10-17 ENCOUNTER — Telehealth: Payer: Self-pay

## 2014-10-17 ENCOUNTER — Other Ambulatory Visit: Payer: Self-pay

## 2014-10-17 NOTE — Telephone Encounter (Signed)
Patient cancelled her appointment (7.20.16 @ 2:30pm COL) due to a conflict, she requested a call back @ [phone] to reschedule.

## 2014-10-23 NOTE — Telephone Encounter (Signed)
Appointment for Colpo/LGSIL scheduled by phone for Wed. 11/28/14 at 2:15 PM.  Letter sent per patient request.

## 2014-11-05 NOTE — Telephone Encounter (Signed)
LVM at [phone] for patient to call [facility].  Letter requested per patient retruned for bad address.  Please update demographics and remind patient of upcomming Colpo appointment 11-28-14 at 2:15 PM.

## 2014-11-28 ENCOUNTER — Ambulatory Visit: Payer: Self-pay | Admitting: Obstetrics and Gynecology

## 2014-11-28 VITALS — BP 168/78 | HR 78 | Ht 63.0 in | Wt 214.0 lb

## 2014-11-28 DIAGNOSIS — R87610 Atypical squamous cells of undetermined significance on cytologic smear of cervix (ASC-US): Secondary | ICD-10-CM

## 2014-11-28 DIAGNOSIS — Z01812 Encounter for preprocedural laboratory examination: Secondary | ICD-10-CM

## 2014-11-28 LAB — POCT URINE PREGNANCY
Lot #: 152381
Lot #: 152381

## 2014-11-28 NOTE — Procedures (Signed)
Colposcopy Procedure Note    Indication for colposcopy:   This is a 29 y.o. G2P0020 with most recent pap smear 10 months ago showing: LSIL.     Dysplasia History:  2009 pap - ASCUS with POSITIVE high risk HPV    12/2012 pap - LSIL    02/2013 cervical bx at 7 o'clock - CIN 1   01/2014 pap - LSIL     Risk Factors:  Tobacco: former smoker, quit 1.5 years ago  Prior STDs: history of GC/CT in teens  HIV status: unknown    UPT result:   PREG TEST,UR POC   Date Value Ref Range Status   11/28/2014  Negative-Dilute urine specimens may cause false negative urine pregnancy results... Final    Negative-Dilute urine specimens may cause false negative urine pregnancy results...     LMP:  11/09/2014  Current Contraception method: none (desiring pregnancy)    History: Medical, surgical, family, and social history, as well as medications and allergies, were all verified and updated.    Domestic violence:No    Consent:  The procedure risks, benefits and side effects were explained to the patient. Questions were answered and an informed consent was signed.    Pre-Procedural Time Out:  Date: 11/28/2014       Time: 2:45 PM    Correct Procedure: Yes  Correct Patient: (use 2 Identifiers) Yes  Correct Site: Yes  Correct Patient Position: Yes  Appropriate Hand Hygiene Used: Yes  List Any Participants Involved in Time-Out: patient, Carolin Coy, MD, Reynold Bowen, MD  Availability of correct implants and any special equipment: Yes    Procedure Details:  The patient was placed in the dorsal lithotomy position. Speculum was placed and the cervix was visualized. Acetic acid was applied to the cervix.     Squamarcolumnar junction seen: yes  Transformation zone seen: yes  Canal clear: yes  Atypical vessels: yes (very fine, small area at 10 o'clock)  Endocervical curettage performed: yes  Cervical biopsies taken: yes, at 10 & 1 o'clock  Estimated blood loss: 5 cc      Patient tolerated the procedure well.  The patient received 600 mg of  ibuprofen in the office.     Specimens to Pathology: Cervical biopsy x 2, endocervical curettage, pap smear    Impression:   Colposcopy was adequate  Colpscopic diagnosis: CIN 1    Plan:  Plan was reviewed with the patient including and handout given:  - Patient will be called to discuss results and further plan, may leave message  - Post colposcopy instructions were reviewed with the patient including:   Warning signs and symptoms of infection and abnormal bleeding   Instructed to avoid coitus, douching and tampons for 14 days.    Questions were answered and the patient states good understanding of instructions.     Clint Lipps, M.D.  Obstetrics & Gynecology PGY-3  As of 3:06 PM on 11/28/2014

## 2014-11-28 NOTE — Patient Instructions (Signed)
Colposcopy    What is Colposcopy?  Colposcopy is a special visual examination of the cervix, vagina, and sometimes the outer lips or vulvar area. If you have an abnormal Pap smear, you may require a colposcopy. This requires your healthcare provider to look through an instrument called a “colposcope” which is a type of microscope mounted on a pole. The colposcope helps your healthcare provider check for problems, which are often very small, on the cervix and vagina and may not be seen during your  regular exam. If abnormalities are seen, a small sample of tissue, called a biopsy, is usually done. The biopsy gives your healthcare provider important information to decide if treatment would be necessary. Biopsies may cause mild cramping. The colposcopy exam takes about 10 to 20 minutes    Who needs Colposcopy?  Colposcopy is most often advised for women who have had an abnormal Pap smear. An abnormal Pap smear may be a sign of a precancerous condition that can then be successfully treated before turning into cancer. Occasionally, a patient may be referred for colposcopy because of some abnormal appearance of their cervix, noted during a pelvic examination.    How is Colposcopy performed?  The colposcope used in colposcopy is instrument that looks like a pair of binoculars mounted on a pole. A speculum is placed in the vagina to hold the vaginal walls open, just as if you were having a Pap smear done, and remains in place until the exam is finished. The colposcope is placed a few inches in front of the vagina, but does not touch you. A repeat Pap smear may also  be done at this time, in the same way it is collected during your prior exam. Your health care provider may place vinegar, and sometimes an iodine solution (notify your healthcare provider if you are allergic to iodine), directly onto the cervix and vagina to identify any abnormal areas. You may have some mild burning or tingling, but most patients do not  experience this. The provider might also adjust the colposcope by changing the magnification or looking through  different colored filters. This helps in finding suspicious areas. Sometime photographs of your cervix, vagina, or vulva are taken during your exam and become part of your medical record.  Abnormal cells that cause cervical disease may extend up toward the lining of the uterus or womb through an opening called the endocervical canal. This is the same birth canal that dilates when women have vaginal childbirth. Sampling the endocervical canal may cause cramping or, rarely, light-headedness. If abnormal areas are seen on the cervix, they often require a biopsy to make a correct diagnosis. The biopsy takes a very small piece of tissue from the abnormal areas. If more than one area is abnormal, several different biopsies may be performed. Any bleeding from the biopsy sites can be stopped using silver nitrate or an iron-containing compound called Monsel’s solution. A pathologist, who will tell your healthcare provider the final diagnosis, then sends the biopsy specimens that are collected to the lab for processing and examination. This may take several days or even a few weeks.    What should you expect after the biopsy?  For about three to five days following a colposcopy with biopsies you may experience some spotting or a brown crumbly discharge, like coffee grounds, which may require you to use a mini-pad. There are few restrictions after the procedure and you may usually go about your daily routine. If biopsies are performed you   may be restricted from certain activities until the spotting  has stopped and your cervix has time to heal.   These include:  •Sexual activity (vaginal intercourse)  •Putting tampons into your vagina  •Washing or douching inside your vagina  You should call your healthcare provider if you have:  •Fever  •Bright red, heavy bleeding which is more than what you have with your  period  •Bad cramps or pain that does not improve with over-the-counter medications,        such as ibuprofen.        What is the treatment after a Colposcopy?  The treatment following a colposcopy depends on the degree of abnormality reported on the biopsy. For minor or low-grade abnormalities, no treatment may be necessary and only a follow up Pap smear or HPV testing would be required. If there are greater or more severe abnormalities, other treatment may be required including destroying surface cells of the cervix with laser or freezing therapies. Another option includes a larger biopsy of the cervix called a “Loop” or loop electro excision procedure (LEEP), which may be performed in the healthcare provider’s office. A surgical conization in the operating room is needed occasionally. Your healthcare provider will discuss these treatments with you, if any is needed. You may also wish to read the [facility] patient education pamphlet on LEEP.      Is Colposcopy safe for pregnant women?  As with non-pregnant women any abnormal Pap smear in pregnancy needs further examination. Looking through the colposcope is completely safe and biopsies may be performed in pregnancy if suspicious areas are identified. Sampling from the birth canal, such as by an endocervical curettage, should not be performed in pregnancy. Most of the treatment methods which are used  on non-pregnant patients are not recommended during pregnancy. These treatments can usually wait until after the pregnancy is finished. Often a repeat exam is done after the pregnancy to determine if treatment is still necessary.    What are the risks to Colposcopy?  There are no serious risks to colposcopy, which is performed routinely in the healthcare provider’s clinic. The most likely side effect is mild discomfort from the solutions used and cramping or pinching from the endocervical curettage or biopsies. There may be a small amount of spotting for a few days, but  heavy bleeding is rare. The Monsel’s solution or silver nitrate used to help stop bleeding may also cause a brown crumbly discharge for two to three days.    Is there anything I should or should not do before my Colposcopy?  If your abnormal Pap test was done by another healthcare provider, it is useful to have a copy of the report at the time of your referral for a colposcopy. Undergoing colposcopy does not require any special preparation. You may take over-the-counter medications like ibuprofen or acetaminophen before the procedure to help reduce the cramping. To avoid obscuring the abnormal cells, it is best to avoid anything in the vagina for two days prior to the procedure such as sexual activity, intravaginal medications, tampons, or douching. Try to avoid having colposcopy during your period, but if the bleeding is light, the exam may still be satisfactory.    Reviewed 12/2009

## 2014-11-30 LAB — SURGICAL PATHOLOGY

## 2014-12-04 LAB — GYN CYTOLOGY

## 2014-12-05 ENCOUNTER — Telehealth: Payer: Self-pay | Admitting: Obstetrics & Gynecology

## 2014-12-05 NOTE — Telephone Encounter (Signed)
This patient has a h/o LSIL pap.  She is s/p colposcopy on 11/28/14.  Papsmear, ECC, and biopsies were negative for abnormalities.  Patient needs f/u in 1 year with repeat pap + cotesting.  Attempted to call patient today but was unable to reach her.  Can we please call the patient to inform her of results/plan?  Thanks!    Shelton Silvas MD  OBGYN R3  [phone]

## 2014-12-06 NOTE — Telephone Encounter (Signed)
Spoke with pt- provided with results- pt voiced understanding- will call in June to schedule appt for annual with [facility].

## 2015-02-04 ENCOUNTER — Ambulatory Visit: Payer: Self-pay | Admitting: Obstetrics & Gynecology

## 2015-03-21 ENCOUNTER — Encounter: Payer: Self-pay | Admitting: Emergency Medicine

## 2015-03-21 ENCOUNTER — Emergency Department
Admission: EM | Admit: 2015-03-21 | Disposition: A | Discharge status: Home / Self Care | Payer: Self-pay | Source: Ambulatory Visit

## 2015-03-21 LAB — HM HIV SCREENING OFFERED

## 2015-03-21 MED ORDER — KETOROLAC TROMETHAMINE 30 MG/ML IJ SOLN *I*
60.0000 mg | Freq: Once | INTRAMUSCULAR | Status: AC
Start: 2015-03-21 — End: 2015-03-21
  Administered 2015-03-21: 60 mg via INTRAMUSCULAR
  Filled 2015-03-21: qty 2

## 2015-03-21 MED ORDER — CYCLOBENZAPRINE HCL 10 MG PO TABS *I*
10.0000 mg | ORAL_TABLET | Freq: Three times a day (TID) | ORAL | 0 refills | Status: AC | PRN
Start: 2015-03-21 — End: 2015-04-20

## 2015-03-21 MED ORDER — NAPROXEN SODIUM 550 MG PO TABS *A*
550.0000 mg | ORAL_TABLET | Freq: Two times a day (BID) | ORAL | 0 refills | Status: DC
Start: 2015-03-21 — End: 2016-02-07

## 2015-03-21 NOTE — ED Notes (Signed)
Pt returned from xray

## 2015-03-21 NOTE — ED Notes (Signed)
Pt to xray
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
## 2015-03-21 NOTE — Discharge Instructions (Signed)
You have been seen and evaluated at [facility] by Rogelia BogaElizabeth Salena Ortlieb on 03/21/15    Your discharge diagnosis is   It is recommend that you:  Rest.  Increase activity as tolerated.  Apply ice or warm moist heat to the sore areas.  Naproxen   Add Flexeril 10 mg every 8 hrs for spasm.  No driving, climbing, working with machinery or other dangerous activities while taking these medicines.  If more pain, weakness, numbness, tingling, incontinence, etc get rechecked sooner.  Follow up with your doctor in the next 5-7 days or call sooner as needed.      Please follow up with your primary care provider if symptoms do not improve and for general care. Return to ED if symptoms worsen

## 2015-06-12 ENCOUNTER — Ambulatory Visit: Payer: Self-pay | Admitting: Obstetrics and Gynecology

## 2015-06-12 ENCOUNTER — Telehealth: Payer: Self-pay

## 2015-06-12 NOTE — Telephone Encounter (Signed)
Contact patient d/t bumped appt- she was happy we contacted her as she wanted to reschedule.     Booked for Monday 3/20 with next provider of choice.     Julieta GuttingJenna Daija Routson RN  Perinatal Nurse Coordinator  [facility]  [phone]

## 2015-06-17 ENCOUNTER — Ambulatory Visit: Payer: Self-pay | Admitting: Reproductive Endocrinology and Infertility

## 2015-06-17 ENCOUNTER — Ambulatory Visit: Payer: Self-pay | Admitting: Obstetrics and Gynecology

## 2015-06-24 ENCOUNTER — Ambulatory Visit: Payer: Self-pay | Admitting: Reproductive Endocrinology and Infertility

## 2015-06-24 ENCOUNTER — Encounter: Payer: Self-pay | Admitting: Reproductive Endocrinology and Infertility

## 2015-06-24 VITALS — BP 144/73 | Ht 63.0 in | Wt 210.0 lb

## 2015-06-24 DIAGNOSIS — B9689 Other specified bacterial agents as the cause of diseases classified elsewhere: Secondary | ICD-10-CM

## 2015-06-24 DIAGNOSIS — N760 Acute vaginitis: Secondary | ICD-10-CM

## 2015-06-24 LAB — POCT VAGINAL WET MOUNT
CLUE CELLS,POC: POSITIVE
KOH FOR FUNGUS: NEGATIVE
TRICHOMONAS, POC: NEGATIVE
WHIFF TEST: POSITIVE
YEAST,POC: NEGATIVE

## 2015-06-24 MED ORDER — METRONIDAZOLE 0.75 % VA GEL *I*
VAGINAL | 1 refills | Status: DC
Start: 2015-06-24 — End: 2015-07-02

## 2015-06-24 NOTE — Addendum Note (Signed)
Addended by: Olin HauserANDREWS, Husayn Reim A on: 06/24/2015 03:54 PM     Modules accepted: Orders

## 2015-06-24 NOTE — Progress Notes (Signed)
Darlene Villegas is a 30 y.o. female who presents for evaluation of an abnormal vaginal discharge. Symptoms have been present for 2weeks. Vaginal symptoms: vaginal symptoms of perineal odor and vaginal discharge white and thin.  She denies burning, itching and painful perineum.  She denies changes to soaps, detergents or hygiene products.Sexually transmitted infection risk: very low risk of STD exposure. Patient's last menstrual period was 05/15/2015. Contraception: none.    Darlene Villegas has Caries; Impacted third molar tooth; Abnormal Pap smear of cervix; and Smoking trying to quit on her problem list.  Current Outpatient Prescriptions on File Prior to Visit   Medication Sig Dispense Refill    naproxen sodium (ANAPROX) 550 MG tablet Take 1 tablet (550 mg total) by mouth 2 times daily (with meals) 30 tablet 0    prenatal multivitamin (PRENAVITE) tablet Take 1 tablet by mouth daily 30 tablet 11    ibuprofen (ADVIL,MOTRIN) 600 MG tablet Take 1 tablet (600 mg total) by mouth 3 times daily as needed 50 tablet 5    diphenhydrAMINE (BENADRYL) 25 MG tablet Take 25 mg by mouth every 6 hours as needed          Loratadine (CLARITIN PO) Take  by mouth.       No current facility-administered medications on file prior to visit.        Review of Systems  GYN ROS:  --GU Sxs:            --Vag DC: Yes            --Dysuria: No            --Bleeding with sex: No  --Breast Sxs: None  --Endocrine Sxs.: None     Objective:     Vitals:    06/24/15 1516   BP: 144/73   Weight: 95.3 kg (210 lb)   Height: 1.6 m (5\' 3" )       General appearance: 30 y.o. female. alert, cooperative and no distress  PELVIC:   Ext Gen: Normal female  Vagina: pink, moderate amount of thin white frothy discharge  Cervix: no discharge, no CMT  Uterus: NSSC, NT  Adnexa: no palpable masses, non-tender    Recent Results (from the past 24 hour(s))   POCT vaginal wet mount    Collection Time: 06/24/15  3:40 PM   Result Value Ref Range    TRICHOMONAS, POC Negative-none seen      YEAST,POC Negative-none seen     YEAST COMMENT,POC      CLUE CELLS,POC Positive moderate     LEUKOCYTES,POC      KOH FOR FUNGUS Negative-none seen     KOH FOR FUNGUS COMMENT      WHIFF TEST Positive       Assessment/Plan:     30 y.o. [id number] with recurrent BV    - Cervical DNA amplification for GC and Chlamydia were collected.  Will follow up with patient for any abnormal results.  She was given a prescription for MetroGel 0.75% one applicator  PV QHS x 10; followed by one applicator weekly x 6 weeks for recurrent BV.  Perineal hygiene and vaginitis prevention discussed.   Darlene Villegas was able to teach back information given to her today.  Return to the office for unresolved or worsening symptoms.  Darlene Villegas verbalized understanding of the treatment plan today and all questions were answered to her satisfaction.     Marguerita MerlesKELLY I Powell Halbert, NP

## 2015-06-25 LAB — CHLAMYDIA PLASMID DNA AMPLIFICATION: Chlamydia Plasmid DNA Amplification: 0

## 2015-06-25 LAB — N. GONORRHOEAE DNA AMPLIFICATION: N. gonorrhoeae DNA Amplification: 0

## 2015-06-25 LAB — TRICHOMONAS DNA AMPLIFICATION: Trichomonas DNA amplification: 0

## 2015-07-01 ENCOUNTER — Telehealth: Payer: Self-pay

## 2015-07-01 NOTE — Telephone Encounter (Signed)
Other option for treatment is oral metronidazole 500mg  po BID x 7 days- no alcohol, would not recommend this on a weekly basis, could use the metrogel weekly x 6 weeks as originally planned with Tresa EndoKelly or not at all and observe for symptoms

## 2015-07-01 NOTE — Telephone Encounter (Signed)
Patient went to pickup her script; metroNIDAZOLE (METROGEL-VAGINAL) 0.75 % vaginal gel from her Pharmacy but the out of pocket expense is too high. Patient currently uninsured, so the RX that was sent (per patient) was a "bulk" Rx (Sig: One applicator PV QHS x 10; then one applicator PV weekly x 6 weeks for recurrent BV) she's requesting 1 box/treatment to be sent so it's more affordable.       FYI: NP Sharyn CreamerKelly Monachino wrote the RX 3.27.17, patient is aware she's out of the office today.

## 2015-07-02 MED ORDER — METRONIDAZOLE 500 MG PO TABS *I*
500.0000 mg | ORAL_TABLET | Freq: Two times a day (BID) | ORAL | 0 refills | Status: AC
Start: 2015-07-02 — End: 2015-07-09

## 2015-07-02 NOTE — Telephone Encounter (Signed)
Spoke to [place] and reviewed options for treatment with her. She has opted to take the Metronidazole 500mg  PO BID for 7 days. She is aware that she cannot take for more than one week but would like to start treatment. Her insurance will be active on May 1st. Allergies and pharmacy were verified.  Message routed to [employer] [place].

## 2015-07-02 NOTE — Telephone Encounter (Signed)
Patient returned call

## 2015-07-02 NOTE — Telephone Encounter (Signed)
LM for pt to call [facility]

## 2015-07-02 NOTE — Telephone Encounter (Signed)
Rx metronidazole 500mg  po BID x 7 days- no alcohol, sent to pharmacy in chart

## 2015-07-07 ENCOUNTER — Encounter: Payer: Self-pay | Admitting: Student in an Organized Health Care Education/Training Program

## 2015-07-07 ENCOUNTER — Emergency Department
Admission: EM | Admit: 2015-07-07 | Disposition: A | Discharge status: Home / Self Care | Payer: Self-pay | Source: Ambulatory Visit | Attending: Emergency Medicine | Admitting: Emergency Medicine

## 2015-07-07 NOTE — ED Notes (Signed)
Pt requesting to leave. Provider notified.  
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
## 2015-07-07 NOTE — ED Notes (Signed)
Verbalized understanding of discharge instructions. Pt is alert and oriented x4. Belongings returned to pt. Pt ambulating independently with steady gait. Mother at bedside and will bring her home.

## 2015-07-07 NOTE — ED Notes (Signed)
Pt no longer in room. Public safety notified and aware of pt. Pt was not seen leaving.

## 2015-07-07 NOTE — ED Procedure Documentation (Addendum)
Procedures   Laceration repair  Date/Time: 07/07/2015 9:10 AM  Performed by: Myra RudeERETTO, VINCENT SAMUEL  Authorized by: Danie BinderYERMAN, Kishan Wachsmuth   Consent: Verbal consent obtained. Written consent obtained.  Consent given by: patient  Patient understanding: patient states understanding of the procedure being performed  Patient consent: the patient's understanding of the procedure matches consent given  Time out: Immediately prior to procedure a "time out" was called to verify the correct patient, procedure, equipment, support staff and site/side marked as required.  Body area: head/neck  Location details: left eyelid  Laceration length: 1.5 cm  Foreign bodies: no foreign bodies  Tendon involvement: none  Nerve involvement: none  Vascular damage: no  Anesthesia: local infiltration    Anesthesia:  Anesthesia: local infiltration  Local Anesthetic: lidocaine 2% without epinephrine   Anesthetic total: 2 mL  Sedation:  Patient sedated: no    Preparation: Patient was prepped and draped in the usual sterile fashion.  Irrigation solution: saline  Irrigation method: jet lavage  Amount of cleaning: standard  Debridement: none  Skin closure: 5-0 Prolene  Number of sutures: 3  Technique: interrupted  Approximation: loose  Dressing: 4x4 sterile gauze and antibiotic ointment  Patient tolerance: Patient tolerated the procedure well with no immediate complications          Myra RudeVincent Samuel Ceretto, DO     Ceretto, Venetia ConstableVincent Samuel, DO  Resident  07/07/15 [phone]      Resident Attestation:     Patient seen by me on arrival date of 07/07/2015 at the time of arrival 7:11 AM    I was present and participated during the critical and key portions and was immediately available during the remainder of the procedure.  Patient was grateful for the repair she received ER    Danie BinderBRYCE Lannie Yusuf, MD    Author Danie BinderBRYCE Etsuko Dierolf, MD     Danie BinderYerman, Sabrina Keough, MD  07/07/15 [phone]
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
## 2015-07-07 NOTE — ED Notes (Signed)
Provider at bedside addressing wounds.
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
## 2015-07-07 NOTE — ED Notes (Signed)
Per provider pt can drink.
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
## 2015-07-07 NOTE — ED Notes (Signed)
Pt had gone to bathroom in different location of ED. Pt was brought back to room by Clinical research associatewriter. Pt is alert and oriented x3. Ambulating with steady gait. Appears in no apparent distress

## 2015-07-07 NOTE — ED Provider Progress Notes (Signed)
ED Provider Progress Note    Patient doing much better.  Currently is not complaining of any other pain besides her left eye.  Is able to open up her eye completely there is no conjunctival hemorrhage or pain/deficit with range of motion.  Patient is currently with her mother who is going to give her a ride home to a safe place.  She is requesting to go home at this time.  I explained the risks of going back which included being unable to monitor her for possible intracranial hemorrhage even though the CT scan was negative as well as other injuries which can be masked by alcohol.  I explained that she cannot drive or do anything that is dangerous while intoxicated..  She understands and still wants to go home.  All questions and concerns were answered.     Myra RudeVincent Samuel Crysten Kaman, DO, 07/07/2015, 11:47 AM         [name], Venetia ConstableVincent Samuel, DO  Resident  07/07/15 [phone]

## 2015-07-24 ENCOUNTER — Telehealth: Payer: Self-pay

## 2015-07-24 MED ORDER — METRONIDAZOLE 500 MG PO TABS *I*
500.0000 mg | ORAL_TABLET | Freq: Two times a day (BID) | ORAL | 0 refills | Status: AC
Start: 2015-07-24 — End: 2015-07-31

## 2015-07-24 NOTE — Telephone Encounter (Signed)
Spoke to patient to review Warshof's instructions. Pt states a good understanding.

## 2015-07-24 NOTE — Telephone Encounter (Signed)
Rx metronidazole 500mg  po BID x 7 days sent to pharmacy in chart, please review alcohol precautions, if pt symptoms continue after completing prescription she will need an OV for evaluation

## 2015-07-24 NOTE — Telephone Encounter (Signed)
Patient calling stating that she had lost part of the metroNIDAZOLE (FLAGYL) rx and has symptoms. Patient is wondering if she can have a new rx be sent to [facility] on [address].

## 2015-07-24 NOTE — Telephone Encounter (Signed)
Spoke with patient who is calling today, requesting a refill of her Flagyl, as she took approximately 1/2 of the RX sent on 07/02/2015.  Her symptoms seemed to resolve so she d/c'd taking the remaining rx.  Patient states her vaginal odor and discharge have returned, and she cannot locate her remaining pills.  Reinforced importance of completing treatment AND no drinking alcohol while taking this medication (see ED note from 07/07/2015). Sending to K. Warshof for review and possible renewal of rx, sent to confirmed pharmacy.

## 2015-10-31 ENCOUNTER — Encounter: Payer: Self-pay | Admitting: Obstetrics and Gynecology

## 2015-12-11 ENCOUNTER — Telehealth: Payer: Self-pay

## 2015-12-11 NOTE — Telephone Encounter (Signed)
GYN/lv 06/24/15    Patient states she has been passing very dark blood for the past 2-3 months lasting 12-13 days and is crampy    Not on birth control

## 2015-12-11 NOTE — Telephone Encounter (Signed)
Patient states she is having irregular vaginal bleeding for the last few months. She also notes the flow as light and very dark in color. Her last cycle began 8/27 and she is still having light bleeding today. She denies any lightheadedness or dizziness. She also reports some cramping.   She accepted appointment with her CCP, Pittinaro for 9/21 at 3:30pm. Reviewed bleeding precautions. Elita QuickShaneika voiced understanding.

## 2015-12-19 ENCOUNTER — Ambulatory Visit: Payer: Self-pay | Admitting: Obstetrics and Gynecology

## 2016-01-29 ENCOUNTER — Telehealth: Payer: Self-pay

## 2016-02-04 ENCOUNTER — Ambulatory Visit: Payer: Self-pay | Admitting: Obstetrics and Gynecology

## 2016-02-06 NOTE — Telephone Encounter (Signed)
A user error has taken place: encounter opened in error, closed for administrative reasons.

## 2016-02-07 ENCOUNTER — Ambulatory Visit: Payer: Self-pay | Admitting: Obstetrics and Gynecology

## 2016-02-07 ENCOUNTER — Telehealth: Payer: Self-pay

## 2016-02-07 VITALS — BP 138/67 | HR 77 | Ht 63.0 in | Wt 205.0 lb

## 2016-02-07 DIAGNOSIS — N760 Acute vaginitis: Secondary | ICD-10-CM

## 2016-02-07 DIAGNOSIS — B9689 Other specified bacterial agents as the cause of diseases classified elsewhere: Secondary | ICD-10-CM

## 2016-02-07 DIAGNOSIS — N898 Other specified noninflammatory disorders of vagina: Secondary | ICD-10-CM

## 2016-02-07 LAB — POCT VAGINAL WET MOUNT
CLUE CELLS,POC: POSITIVE
KOH FOR FUNGUS: NEGATIVE
TRICHOMONAS, POC: NEGATIVE
WHIFF TEST: POSITIVE
YEAST,POC: NEGATIVE
pH: 5.5

## 2016-02-07 MED ORDER — METRONIDAZOLE 500 MG PO TABS *I*
500.0000 mg | ORAL_TABLET | Freq: Two times a day (BID) | ORAL | 0 refills | Status: AC
Start: 2016-02-07 — End: 2016-02-14

## 2016-02-07 MED ORDER — METRONIDAZOLE 0.75 % VA GEL *I*
VAGINAL | 1 refills | Status: DC
Start: 2016-02-07 — End: 2016-07-24

## 2016-02-07 NOTE — Patient Instructions (Signed)
Bacterial Vaginosis    The Basics   Written by the doctors and editors at UpToDate   What is bacterial vaginosis? -- Bacterial vaginosis is an infection in the vagina that can cause bad-smelling vaginal discharge. “Vaginal discharge” is the term doctors and nurses use to describe any fluid that comes out of the vagina (figure 1). Normally, women have a small amount of vaginal discharge each day. But women with bacterial vaginosis can have a lot of vaginal discharge, or vaginal discharge that smells bad.  Bacterial vaginosis is caused by certain bacteria (germs). The vagina normally has different types of bacteria in it. When the amounts or the types of bacteria change, an infection can happen.   Women do not catch bacterial vaginosis from having sex. But women who have bacterial vaginosis have a higher chance of catching other infections from their partner during sex.  What are the symptoms of bacterial vaginosis? -- Most women with bacterial vaginosis have no symptoms. When women have symptoms, they often have a “fishy-smelling” vaginal discharge that they might notice more after sex. The discharge is watery and off-white or gray.   Is there a test for bacterial vaginosis? -- Yes. Your doctor or nurse will do an exam. He or she will also take a sample of your vaginal discharge, and do lab tests on the sample to look for an infection.  How is bacterial vaginosis treated? -- Bacterial vaginosis is treated with medicine. Two different medicines can be used. They are called:  · Metronidazole  · Clindamycin  Both of these medicines come in different forms. They can come as a pill or as a gel or cream that a woman puts inside her vagina. Most women have fewer side effects when they use the gel or cream treatment. But you and your doctor or nurse will decide which medicine and which form is right for you.   It is important that you take all of the medicine your doctor or nurse prescribes, even if your symptoms go  away after a few doses. Taking all of your medicine can help prevent the symptoms from coming back.  Does my sex partner need to be treated if I have bacterial vaginosis? -- No. Your sex partner does not need to be treated if you have bacterial vaginosis.  What happens if my symptoms come back? -- If your symptoms come back, let your doctor or nurse know. You might need treatment with more medicine.   Some women get bacterial vaginosis over and over again. These women might take medicine for 3 to 6 months to try to prevent future infections.  What if I am pregnant and have symptoms of bacterial vaginosis? -- If you are pregnant and have symptoms of bacterial vaginosis, tell your doctor or nurse. You might need treatment with medicine.   Can bacterial vaginosis be prevented? -- Sometimes. You can help prevent bacterial vaginosis by:  · Not douching (douching is when a woman puts a liquid inside her vagina to rinse it out)  · Not having a lot of sex partners  · Not smoking  All topics are updated as new evidence becomes available and our peer review process is complete.  This topic retrieved from UpToDate on: Jul 19, 2013.  Topic 15592 Version 4.0  Release: 23.3 - C23.74  © 2015 UpToDate, Inc. All rights reserved.  figure 1: Adult female external genitalia     This drawing shows the parts of a woman's genitals.  Graphic 53704 Version 7.0  Consumer Information Use and Disclaimer   This information is not specific medical advice and does not   replace information you receive from your health care provider. This is only a brief summary of general information. It does NOT include all information about conditions, illnesses, injuries, tests, procedures, treatments, therapies, discharge instructions or life-style choices that may apply to you. You must talk with your health care provider for complete information about your health and treatment options. This information should not be used to decide whether or not to accept  your health care provider's advice, instructions or recommendations. Only your health care provider has the knowledge and training to provide advice that is right for you.The use of UpToDate content is governed by the UpToDate Terms of Use. ©2015 [employer]Date, Inc. All rights reserved.  Copyright   © 2015 UpToDate, Inc. All rights reserved.

## 2016-02-07 NOTE — Progress Notes (Addendum)
This 30 y.o. female presents with a history of vaginal discharge white, thin and malodorousHas had these symptoms for over a month  She denies any concern for sexually transmitted infections. Denies any new partners.  She denies any changes in hygiene practices or products.    NWG:NFAOZHYQMROS:discharge    Past Medical History:   Diagnosis Date    Abnormal Pap smear     Anemia     Irregular periods/menstrual cycles        Past Surgical History:   Procedure Laterality Date    ECTOPIC PREGNANCY SURGERY  2009    OVARY REMOVAL      07/2007 with ectopic    PELVIC LAPAROSCOPY      10/2007 - [facility]       OB History     Gravida Para Term Preterm AB Living    2    2 0    SAB TAB Ectopic Multiple Live Births      2            O:  Pelvic exam: VULVA: normal appearing vulva with no masses, tenderness or lesions, VAGINA: normal appearing vagina with normal color and discharge, no lesions, vaginal discharge - white, grey, malodorous, milky and thin, CERVIX: normal appearing cervix without discharge or lesions, UTERUS: uterus is normal size, shape, consistency and nontender, ADNEXA: normal adnexa in size, nontender and no masses  pH > 5  Recent Results (from the past 24 hour(s))   POCT vaginal wet mount    Collection Time: 02/07/16  2:22 PM   Result Value Ref Range    TRICHOMONAS, POC Negative-none seen     YEAST,POC Negative-none seen     YEAST COMMENT,POC      CLUE CELLS,POC Positive few     LEUKOCYTES,POC      KOH FOR FUNGUS Negative-none seen     KOH FOR FUNGUS COMMENT      WHIFF TEST Positive     pH 5.5        A:  Vaginal Discharge: Bacterial Vaginosis    P:  Reviewed vaginitis precautions  I will call patient if NAATfor GC/CT/T are positive  RX sent to her pharmacy. Instructions reviewed. Will take 7 days of flagyl then Metrogel weekly for suppression.  Encouraged safe sex practices  RTC if symptoms do not improve or worsen with treatment.

## 2016-02-07 NOTE — Telephone Encounter (Signed)
Pharmacist requested a call back to provide the instructions for the; metroNIDAZOLE (METROGEL-VAGINAL) 0.75 % vaginal gel.

## 2016-02-07 NOTE — Addendum Note (Signed)
Addended by: Loma NewtonGILBERT, Jayden Rudge on: 02/07/2016 03:42 PM     Modules accepted: Orders

## 2016-02-11 ENCOUNTER — Telehealth: Payer: Self-pay | Admitting: Obstetrics and Gynecology

## 2016-02-11 DIAGNOSIS — A5901 Trichomonal vulvovaginitis: Secondary | ICD-10-CM | POA: Insufficient documentation

## 2016-02-11 LAB — CHLAMYDIA PLASMID DNA AMPLIFICATION: Chlamydia Plasmid DNA Amplification: 0

## 2016-02-11 LAB — TRICHOMONAS DNA AMPLIFICATION: Trichomonas DNA amplification: POSITIVE — AB

## 2016-02-11 LAB — N. GONORRHOEAE DNA AMPLIFICATION: N. gonorrhoeae DNA Amplification: 0

## 2016-02-11 MED ORDER — METRONIDAZOLE 500 MG PO TABS *I*
500.0000 mg | ORAL_TABLET | Freq: Once | ORAL | 0 refills | Status: AC
Start: 2016-02-11 — End: 2016-02-11

## 2016-02-11 NOTE — Telephone Encounter (Signed)
Spoke to patient discussed lab results. RX sent to her pharmacy. She has had trich in the past and understands this is a STI. Partner needs treatment.  Safer sex practices encouraged.   Patient will call for a follow up appointment in 4 weeks.

## 2016-02-11 NOTE — Assessment & Plan Note (Signed)
rx for Flagyl sent to her pharmacy.

## 2016-02-17 ENCOUNTER — Telehealth: Payer: Self-pay

## 2016-02-17 NOTE — Telephone Encounter (Signed)
Patient was seen on 11/14 and rx was sent for Flagyl/500mg , patient states she is still having same symptoms 1 week later

## 2016-02-17 NOTE — Telephone Encounter (Signed)
Spoke with pt- states she took the four pills for trich but does not think the BV has been successfully treated. Was unaware of the rx for metrogel. Pt will pick that up today and use as directed.

## 2016-04-03 ENCOUNTER — Telehealth: Payer: Self-pay

## 2016-04-03 DIAGNOSIS — N760 Acute vaginitis: Secondary | ICD-10-CM

## 2016-04-03 DIAGNOSIS — B9689 Other specified bacterial agents as the cause of diseases classified elsewhere: Secondary | ICD-10-CM

## 2016-04-03 MED ORDER — METRONIDAZOLE 500 MG PO TABS *I*
500.0000 mg | ORAL_TABLET | Freq: Two times a day (BID) | ORAL | 0 refills | Status: AC
Start: 2016-04-03 — End: 2016-04-10

## 2016-04-03 NOTE — Telephone Encounter (Signed)
Patient called in requesting a change to her current metrogel Rx. Patient states she no longer has insurance and can only afford the metronidazole pills. [facility] and he confirmed the patient never picked up the one refill.

## 2016-06-01 ENCOUNTER — Telehealth: Payer: Self-pay

## 2016-06-01 NOTE — Telephone Encounter (Signed)
Patient would like to know if she can get rx in the pill form for MetroNidazole due to Gel cost $74.00 and she need a refill    [facility]    Please call patient when done [phone]

## 2016-06-02 NOTE — Telephone Encounter (Signed)
LMOVM for pt- that appt needed

## 2016-06-02 NOTE — Telephone Encounter (Signed)
Please call patient. She has not been seen in our office for over a month. She may need to be seen.

## 2016-07-24 ENCOUNTER — Ambulatory Visit: Payer: PRIVATE HEALTH INSURANCE | Attending: Obstetrics and Gynecology | Admitting: Obstetrics and Gynecology

## 2016-07-24 ENCOUNTER — Telehealth: Payer: Self-pay

## 2016-07-24 ENCOUNTER — Other Ambulatory Visit: Payer: Self-pay | Admitting: Obstetrics and Gynecology

## 2016-07-24 ENCOUNTER — Encounter: Payer: Self-pay | Admitting: Obstetrics and Gynecology

## 2016-07-24 VITALS — BP 137/65 | HR 81 | Ht 63.0 in | Wt 210.0 lb

## 2016-07-24 DIAGNOSIS — N760 Acute vaginitis: Secondary | ICD-10-CM | POA: Insufficient documentation

## 2016-07-24 DIAGNOSIS — N898 Other specified noninflammatory disorders of vagina: Secondary | ICD-10-CM | POA: Insufficient documentation

## 2016-07-24 DIAGNOSIS — B9689 Other specified bacterial agents as the cause of diseases classified elsewhere: Secondary | ICD-10-CM | POA: Insufficient documentation

## 2016-07-24 LAB — POCT VAGINAL WET MOUNT
CLUE CELLS,POC: POSITIVE
KOH FOR FUNGUS: NEGATIVE
TRICHOMONAS, POC: NEGATIVE
WHIFF TEST: POSITIVE
YEAST,POC: NEGATIVE

## 2016-07-24 MED ORDER — METRONIDAZOLE 500 MG PO TABS *I*
500.0000 mg | ORAL_TABLET | Freq: Two times a day (BID) | ORAL | 0 refills | Status: DC
Start: 2016-07-24 — End: 2016-07-24

## 2016-07-24 MED ORDER — METRONIDAZOLE 500 MG PO TABS *I*
500.0000 mg | ORAL_TABLET | Freq: Two times a day (BID) | ORAL | 0 refills | Status: AC
Start: 2016-07-24 — End: 2016-07-31

## 2016-07-24 NOTE — Progress Notes (Signed)
Subjective:       Darlene Villegas is a 31 y.o. female who presents for evaluation of an abnormal vaginal discharge. Symptoms have been present for 2 weeks. Vaginal symptoms: discharge described as creamy, local irritation and odor. Has not used OTC medications for her symptoms.  Does not suspect partner infidelity, but desires cervical cultures.      Patient's medications, allergies, past medical, surgical, social and family histories were reviewed and updated as appropriate.      Review of Systems  Pertinent items are noted in HPI.      Objective:      BP 137/65 (BP Location: Right arm, Patient Position: Sitting, Cuff Size: large adult)   Pulse 81   Ht 1.6 m ( )   Wt 95.3 kg (210 lb)   LMP 07/24/2016 (Approximate) Comment: spotting, nothing in march, LMP Begining of Feb   BMI 37.2 kg/m2  General appearance: alert and no distress  Pelvic         Ext gen:  No masses, NT         Int gen:  Negative BSU, pink, rugated with scant white malodorous discharge         Cervix: Mid, pink, os closed without discharge.             Recent Results (from the past 24 hour(s))   POCT vaginal wet mount    Collection Time: 07/24/16  2:45 PM   Result Value Ref Range    TRICHOMONAS, POC Negative-none seen Negative    YEAST,POC Negative-none seen Negative    YEAST COMMENT,POC      CLUE CELLS,POC Positive many Negative    LEUKOCYTES,POC  Negative to Few    KOH FOR FUNGUS Negative-none seen Negative    KOH FOR FUNGUS COMMENT      WHIFF TEST Positive Negative            Assessment:      Bacterial vaginosis.      Plan:     GC, CT, and trichomoniasis sent   Symptomatic local care discussed.  Transport planner distributed.  Oral antibiotics see orders.  RTO prn    Patient able to teach back information given to her today.

## 2016-07-24 NOTE — Patient Instructions (Signed)
Metronidazole (Systemic) (met roe NYE da zole)    Brand Names: [place] Flagyl; Flagyl ER; Metro   Brand Names: [place] Flagyl; Metronidazole Injection USP; Novo-Nidazol; PMS-Metronidazole   Warning    Metronidazole has been shown to cause cancer in mice and rats with long-term use. Talk with the doctor.   Do not use this drug for other health problems.  What is this drug used for?    It is used to treat infections.   It is used to prevent infections during bowel surgery.   It may be given to you for other reasons. Talk with the doctor.  What do I need to tell my doctor BEFORE I take this drug?    If you have an allergy to metronidazole or any other part of this drug.   If you are allergic to any drugs like this one, any other drugs, foods, or other substances. Tell your doctor about the allergy and what signs you had, like rash; hives; itching; shortness of breath; wheezing; cough; swelling of face, lips, tongue, or throat; or any other signs.   If you are less than [redacted] weeks pregnant. This drug is not for use in certain patients who are less than [redacted] weeks pregnant.   If you have taken disulfiram within the past 2 weeks.   This is not a list of all drugs or health problems that interact with this drug.   Tell your doctor and pharmacist about all of your drugs (prescription or OTC, natural products, vitamins) and health problems. You must check to make sure that it is safe for you to take this drug with all of your drugs and health problems. Do not start, stop, or change the dose of any drug without checking with your doctor.  What are some things I need to know or do while I take this drug?    Tell dentists, surgeons, and other doctors that you use this drug.   Have your blood work checked. Talk with your doctor.   This drug may affect certain lab tests. Be sure your doctor and lab workers know you take this drug.   Avoid drinking alcohol. Avoid alcohol for at least 72 hours after the last dose.  Drinking alcohol or taking products that have alcohol, such as cough syrup, may cause cramps, upset stomach, headaches, and flushing.   Do not use longer than you have been told. A second infection may happen.   If you are 68 or older, use this drug with care. You could have more side effects.   Tell your doctor if you are pregnant or plan on getting pregnant. You will need to talk about the benefits and risks of using this drug while you are pregnant.   Tell your doctor if you are breast-feeding. You will need to talk about any risks to your baby.  What are some side effects that I need to call my doctor about right away?    WARNING/CAUTION: Even though it may be rare, some people may have very bad and sometimes deadly side effects when taking a drug. Tell your doctor or get medical help right away if you have any of the following signs or symptoms that may be related to a very bad side effect:   All products:   Signs of an allergic reaction, like rash; hives; itching; red, swollen, blistered, or peeling skin with or without fever; wheezing; tightness in the chest or throat; trouble breathing or talking; unusual hoarseness; or swelling of  the mouth, face, lips, tongue, or throat.  · Change in balance.  · Trouble speaking.  · Seizures.  · Redness or white patches in mouth or throat.  · Some people who took this drug for a long time have had nerve problems that lasted for a long time. Call your doctor right away if you have a burning, numbness, or tingling feeling that is not normal.  · This drug may raise the chance of a very bad brain problem called aseptic meningitis. Call your doctor right away if you have a headache, fever, chills, very upset stomach or throwing up, stiff neck, rash, bright lights bother your eyes, feeling sleepy, or change in thinking clearly and with logic.  · Shot:  · Redness or swelling where the shot is given.  What are some other side effects of this drug?   · All drugs may cause  side effects. However, many people have no side effects or only have minor side effects. Call your doctor or get medical help if any of these side effects or any other side effects bother you or do not go away:  · All products:  · Upset stomach or throwing up.  · Loose stools (diarrhea).  · Hard stools (constipation).  · Headache.  · Not hungry.  · Metallic taste.  · Shot:  · Irritation where the shot is given.  · These are not all of the side effects that may occur. If you have questions about side effects, call your doctor. Call your doctor for medical advice about side effects.  · You may report side effects to your national health agency.  How is this drug best taken?   · Use this drug as ordered by your doctor. Read all information given to you. Follow all instructions closely.  · All products:  · To gain the most benefit, do not miss doses.  · Take as you have been told, even if you feel well.  · Short-acting products:  · Take with or without food. Take with food if it causes an upset stomach.  · Long-acting products:  · Take on an empty stomach. Take 1 hour before or 2 hours after meals.  · Swallow whole. Do not chew, break, or crush.  · Shot:  · It is given as a shot into a vein over a period of time.  · Your doctor may teach you how to use.  · Follow how to use carefully.  · Do not use if the solution is cloudy, leaking, or has particles.  · Do not use if solution changes color.  · Throw away needles in a needle/sharp disposal box. Do not reuse needles or other items. When the box is full, follow all local rules for getting rid of it. Talk with a doctor or pharmacist if you have any questions.  What do I do if I miss a dose?   · Take a missed dose as soon as you think about it.  · If it is close to the time for your next dose, skip the missed dose and go back to your normal time.  · Do not take 2 doses at the same time or extra doses.  How do I store and/or throw out this drug?   · All oral  products:  · Store at room temperature.  · Store in a dry place. Do not store in a bathroom.  · Shot:  · Most of the time, this drug will be given in   this drug will be given in a hospital or doctor's office. If stored at home, follow how to store as you were told by the doctor.   All products:   Keep all drugs out of the reach of children and pets.   Check with your pharmacist about how to throw out unused drugs.  General drug facts    If your symptoms or health problems do not get better or if they become worse, call your doctor.   Do not share your drugs with others and do not take anyone else's drugs.   Keep a list of all your drugs (prescription, natural products, vitamins, OTC) with you. Give this list to your doctor.   Talk with the doctor before starting any new drug, including prescription or OTC, natural products, or vitamins.   Some drugs may have another patient information leaflet. If you have any questions about this drug, please talk with your doctor, pharmacist, or other health care provider.   If you think there has been an overdose, call your poison control center or get medical care right away. Be ready to tell or show what was taken, how much, and when it happened.  Consumer Information Use and Disclaimer    This information should not be used to decide whether or not to take this medicine or any other medicine. Only the healthcare provider has the knowledge and training to decide which medicines are right for a specific patient. This information does not endorse any medicine as safe, effective, or approved for treating any patient or health condition. This is only a brief summary of general information about this medicine. It does NOT include all information about the possible uses, directions, warnings, precautions, interactions, adverse effects, or risks that may apply to this medicine. This information is not specific medical advice and does not replace information you receive from the healthcare provider. You  must talk with the healthcare provider for complete information about the risks and benefits of using this medicine.  Last Reviewed Date   2012-06-01  Copyright     2015 [employer] its affiliates and/or licensors. All rights reserved.

## 2016-07-24 NOTE — Telephone Encounter (Signed)
Pharmacist requesting a call back in regards to an RX they received from our Provider.

## 2016-07-24 NOTE — Telephone Encounter (Signed)
[facility] pharmacy called.  There is a problem with their system that they are trying to fix.

## 2016-07-27 LAB — TRICHOMONAS DNA AMPLIFICATION: Trichomonas DNA amplification: 0

## 2016-07-27 LAB — N. GONORRHOEAE DNA AMPLIFICATION: N. gonorrhoeae DNA Amplification: 0

## 2016-07-27 LAB — CHLAMYDIA PLASMID DNA AMPLIFICATION: Chlamydia Plasmid DNA Amplification: 0

## 2016-08-16 ENCOUNTER — Encounter: Payer: Self-pay | Admitting: Emergency Medicine

## 2016-08-16 ENCOUNTER — Emergency Department
Admission: EM | Admit: 2016-08-16 | Discharge: 2016-08-16 | Disposition: A | Discharge status: Home / Self Care | Payer: PRIVATE HEALTH INSURANCE | Source: Ambulatory Visit | Attending: Emergency Medicine | Admitting: Emergency Medicine

## 2016-08-16 DIAGNOSIS — R222 Localized swelling, mass and lump, trunk: Secondary | ICD-10-CM

## 2016-08-16 DIAGNOSIS — L538 Other specified erythematous conditions: Secondary | ICD-10-CM

## 2016-08-16 DIAGNOSIS — L729 Follicular cyst of the skin and subcutaneous tissue, unspecified: Secondary | ICD-10-CM | POA: Insufficient documentation

## 2016-08-16 NOTE — ED Procedure Documentation (Signed)
Procedures   Incision and drainage  Date/Time: 08/16/2016 4:01 AM  Performed by: Johna SheriffSCHWENNKER, Trichelle Lehan ANNE  Authorized by: Johna SheriffSCHWENNKER, Avonna Iribe ANNE     Consent:     Consent obtained:  Verbal    Consent given by:  Patient    Alternatives discussed:  No treatment  Universal protocol:     Procedure explained and questions answered to patient or proxy's satisfaction: yes      Patient identity confirmed:  Verbally with patient  Location:     Type:  Cyst    Location:  Lower extremity    Lower extremity location:  Hip    Hip location:  R hip  Pre-procedure details:     Skin preparation:  Betadine  Anesthesia (see MAR for exact dosages):     Anesthesia method:  Local infiltration    Local anesthetic:  Lidocaine 1% w/o epi  Procedure details:     Needle aspiration: no      Scalpel blade:  11    Wound management:  Probed and deloculated    Drainage:  Purulent    Drainage amount:  Moderate    Wound treatment:  Wound left open    Packing materials:  None  Post-procedure details:     Patient tolerance of procedure:  Tolerated well, no immediate complications        Johna SheriffELIZABETH ANNE Annalissa Murphey, PA     Francee PiccoloSchwennker, [place], [place]  08/16/16 [phone]

## 2016-08-16 NOTE — ED Provider Notes (Signed)
History     Chief Complaint   Patient presents with    Abscess     Pt states has "something growing" on my rectum.  Was seen by her OB/GYN and was told as long as it doesn't bother you its okay.  Pt states was out at a party tonight and began expecting burning and pain and was very uncomfortable trying to sit down.  Denies any other complaints.        HPI Comments: Patient is a 31 year old female comes into the ED with a cyst to her left buttocks.  States she's had it for over a year but has never been painful.  Over the past week it has increased in size and has become quite painful.  Denies any drainage.  No fevers or chills.      History provided by:  Patient  Language interpreter used: No      Past Medical History:   Diagnosis Date    Abnormal Pap smear     Anemia     Irregular periods/menstrual cycles         Past Surgical History:   Procedure Laterality Date    ECTOPIC PREGNANCY SURGERY  2009    OVARY REMOVAL      07/2007 with ectopic    PELVIC LAPAROSCOPY      10/2007 - [facility]     Family History   Problem Relation Age of Onset    Breast cancer Neg Hx     Cancer Neg Hx     Eclampsia Neg Hx     Diabetes Neg Hx     Colon cancer Neg Hx     Ovarian cancer Neg Hx     Hypertension Neg Hx     Preterm labor Neg Hx     Spont abortions Neg Hx     Stroke Neg Hx     Genetic Disorders Neg Hx     Thrombosis Neg Hx        Social History    reports that she quit smoking about 4 years ago. Her smoking use included Cigarettes. She has a 2.40 pack-year smoking history. She has never used smokeless tobacco. She reports that she drinks about 1.8 oz of alcohol per week  She reports that she currently engages in sexual activity and has had female partners. She reports using the following method of birth control/protection: None. She reports that she does not use illicit drugs.    Living Situation     Questions Responses    Patient lives with Alone    Homeless No    Caregiver for other family member     External  Services     Employment Unemployed    Domestic Violence Risk           Problem List     Patient Active Problem List   Diagnosis Code    Caries K02.9    Impacted third molar tooth K01.1    Abnormal Pap smear of cervix R87.619    Smoking trying to quit Z72.0       Review of Systems   Review of Systems   Skin: Positive for wound.   All other systems reviewed and are negative.      Physical Exam     ED Triage Vitals   BP Heart Rate Heart Rate (via Pulse Ox) Resp Temp Temp src SpO2 (Retired) O2 Device O2 Flow Rate   08/16/16 [id number] 08/16/16 0326 -- 08/16/16 [id number] 08/16/16 [id number] 08/16/16 [id number]  08/16/16 0326 -- --   145/83 129  16 36.8 C (98.2 F) TEMPORAL 96 %        Weight           08/16/16 0326           89.4 kg (197 lb)                    Physical Exam   Constitutional: She is oriented to person, place, and time. Vital signs are normal. She appears well-developed. No distress.   HENT:   Head: Normocephalic and atraumatic.   Eyes: EOM are normal. Pupils are equal, round, and reactive to light.   Neck: Normal range of motion.   Cardiovascular: Normal rate, regular rhythm and normal heart sounds.    Pulmonary/Chest: Effort normal and breath sounds normal.   Abdominal: Soft. Bowel sounds are normal.   Musculoskeletal: Normal range of motion.   Neurological: She is alert and oriented to person, place, and time.   Skin: Skin is warm and dry.        Psychiatric: She has a normal mood and affect. Her behavior is normal.   Nursing note and vitals reviewed.      Medical Decision Making        Initial Evaluation:  ED First Provider Contact     Date/Time Event User Comments    08/16/16 [phone] ED First Provider Contact Rogelia Boga Initial Face to Face Provider Contact          Patient seen by me as above    Assessment:  30 y.o.female comes to the ED with Area of swelling to the left buttocks    Differential Diagnosis includes cyst, cellulitis and likely, abscess unlikely                      Plan:   Cyst  removed    Kaity Pitstick Marcha Solders, PA    Supervising physician Circe was immediately available       Johna Sheriff, [place]  08/16/16 [phone]

## 2016-08-16 NOTE — ED Triage Notes (Signed)
Pt states has "something growing" on my rectum.  Was seen by her OB/GYN and was told as long as it doesn't bother you its okay.  Pt states was out at a party tonight and began expecting burning and pain and was very uncomfortable trying to sit down.  Denies any other complaints.       Triage Note   Dellia NimsLaura Itali Mckendry, RN

## 2016-08-16 NOTE — ED Notes (Signed)
No nursing interventions needed. Verbalized understanding of discharge instructions.  Patient ambulated to waiting rooms.   Pati GalloLucia Doreather Hoxworth, RN

## 2017-02-04 ENCOUNTER — Ambulatory Visit
Admission: RE | Admit: 2017-02-04 | Discharge: 2017-02-04 | Disposition: A | Discharge status: Home / Self Care | Payer: PRIVATE HEALTH INSURANCE | Source: Ambulatory Visit | Attending: Surgery | Admitting: Surgery

## 2017-02-04 ENCOUNTER — Other Ambulatory Visit: Payer: Self-pay | Admitting: Emergency Medicine

## 2017-02-04 ENCOUNTER — Other Ambulatory Visit
Admission: RE | Admit: 2017-02-04 | Discharge: 2017-02-04 | Disposition: A | Discharge status: Home / Self Care | Payer: PRIVATE HEALTH INSURANCE | Source: Ambulatory Visit | Attending: Emergency Medicine | Admitting: Emergency Medicine

## 2017-02-04 DIAGNOSIS — M12531 Traumatic arthropathy, right wrist: Secondary | ICD-10-CM | POA: Insufficient documentation

## 2017-02-04 DIAGNOSIS — M25531 Pain in right wrist: Secondary | ICD-10-CM | POA: Insufficient documentation

## 2017-02-05 LAB — TRICHOMONAS DNA AMPLIFICATION: Trichomonas DNA amplification: 0

## 2017-02-05 LAB — CHLAMYDIA PLASMID DNA AMPLIFICATION: Chlamydia Plasmid DNA Amplification: 0

## 2017-02-05 LAB — AEROBIC CULTURE: Aerobic Culture: 0

## 2017-02-05 LAB — N. GONORRHOEAE DNA AMPLIFICATION: N. gonorrhoeae DNA Amplification: 0

## 2017-02-05 LAB — VAGINITIS SCREEN: DNA PROBE: Vaginitis Screen:DNA Probe: POSITIVE — AB

## 2017-03-05 ENCOUNTER — Ambulatory Visit: Payer: PRIVATE HEALTH INSURANCE | Admitting: Obstetrics and Gynecology

## 2017-03-12 ENCOUNTER — Ambulatory Visit: Payer: PRIVATE HEALTH INSURANCE | Admitting: Obstetrics and Gynecology

## 2017-04-03 ENCOUNTER — Emergency Department
Admission: EM | Admit: 2017-04-03 | Discharge: 2017-04-03 | Disposition: A | Discharge status: Home / Self Care | Payer: PRIVATE HEALTH INSURANCE | Source: Ambulatory Visit | Attending: Emergency Medicine | Admitting: Emergency Medicine

## 2017-04-03 ENCOUNTER — Encounter: Payer: Self-pay | Admitting: Emergency Medicine

## 2017-04-03 DIAGNOSIS — R1032 Left lower quadrant pain: Secondary | ICD-10-CM

## 2017-04-03 DIAGNOSIS — Z113 Encounter for screening for infections with a predominantly sexual mode of transmission: Secondary | ICD-10-CM | POA: Insufficient documentation

## 2017-04-03 DIAGNOSIS — R102 Pelvic and perineal pain: Secondary | ICD-10-CM | POA: Insufficient documentation

## 2017-04-03 DIAGNOSIS — Z87891 Personal history of nicotine dependence: Secondary | ICD-10-CM | POA: Insufficient documentation

## 2017-04-03 DIAGNOSIS — R110 Nausea: Secondary | ICD-10-CM

## 2017-04-03 DIAGNOSIS — R1031 Right lower quadrant pain: Secondary | ICD-10-CM

## 2017-04-03 LAB — BASIC METABOLIC PANEL
Anion Gap: 11 (ref 7–16)
CO2: 23 mmol/L (ref 20–28)
Calcium: 9 mg/dL (ref 8.8–10.2)
Chloride: 105 mmol/L (ref 96–108)
Creatinine: 0.69 mg/dL (ref 0.51–0.95)
GFR,Black: 133 *
GFR,Caucasian: 115 *
Glucose: 88 mg/dL (ref 60–99)
Lab: 11 mg/dL (ref 6–20)
Potassium: 4.2 mmol/L (ref 3.3–5.1)
Sodium: 139 mmol/L (ref 133–145)

## 2017-04-03 LAB — RUQ PANEL (ED ONLY)
ALT: 20 U/L (ref 0–35)
AST: 23 U/L (ref 0–35)
Albumin: 4.3 g/dL (ref 3.5–5.2)
Alk Phos: 76 U/L (ref 35–105)
Amylase: 94 U/L (ref 28–100)
Bilirubin,Direct: <0.2 mg/dL (ref 0.0–0.3)
Bilirubin,Total: 0.2 mg/dL (ref 0.0–1.2)
Globulin: 2.7 g/dL (ref 2.7–4.3)
Lipase: 22 U/L (ref 13–60)
Total Protein: 7 g/dL (ref 6.3–7.7)

## 2017-04-03 LAB — POCT URINALYSIS DIPSTICK
Blood,UA POCT: NEGATIVE
Glucose,UA POCT: 50 mg/dL
Ketones,UA POCT: NEGATIVE mg/dL
Leuk Esterase,UA POCT: NEGATIVE
Lot #: 33502103
Nitrite,UA POCT: NEGATIVE
PH,UA POCT: 5 (ref 5–8)
Protein,UA POCT: NEGATIVE mg/dL

## 2017-04-03 LAB — CBC AND DIFFERENTIAL
Baso # K/uL: 0 10*3/uL (ref 0.0–0.1)
Basophil %: 0.3 %
Eos # K/uL: 0.1 10*3/uL (ref 0.0–0.4)
Eosinophil %: 1.8 %
Hematocrit: 35 % (ref 34–45)
Hemoglobin: 12.5 g/dL (ref 11.2–15.7)
IMM Granulocytes #: 0 10*3/uL (ref 0.0–0.1)
IMM Granulocytes: 0.3 %
Lymph # K/uL: 3.2 10*3/uL (ref 1.2–3.7)
Lymphocyte %: 40.4 %
MCH: 28 pg (ref 26–32)
MCHC: 35 g/dL (ref 32–36)
MCV: 78 fL — ABNORMAL LOW (ref 79–95)
Mono # K/uL: 0.5 10*3/uL (ref 0.2–0.9)
Monocyte %: 5.9 %
Neut # K/uL: 4 10*3/uL (ref 1.6–6.1)
Nucl RBC # K/uL: 0 10*3/uL (ref 0.0–0.0)
Nucl RBC %: 0 /100 WBC (ref 0.0–0.2)
Platelets: 235 10*3/uL (ref 160–370)
RBC: 4.5 MIL/uL (ref 3.9–5.2)
RDW: 13.2 % (ref 11.7–14.4)
Seg Neut %: 51.3 %
WBC: 7.8 10*3/uL (ref 4.0–10.0)

## 2017-04-03 LAB — POCT URINE PREGNANCY

## 2017-04-03 MED ORDER — ONDANSETRON HCL 2 MG/ML IV SOLN *I*
4.0000 mg | Freq: Once | INTRAMUSCULAR | Status: AC
Start: 2017-04-03 — End: 2017-04-03
  Administered 2017-04-03: 4 mg via INTRAVENOUS
  Filled 2017-04-03: qty 2

## 2017-04-03 MED ORDER — KETOROLAC TROMETHAMINE 30 MG/ML IJ SOLN *I*
15.0000 mg | Freq: Once | INTRAMUSCULAR | Status: AC
Start: 2017-04-03 — End: 2017-04-03
  Administered 2017-04-03: 15 mg via INTRAVENOUS
  Filled 2017-04-03: qty 1

## 2017-04-03 MED ORDER — CEFTRIAXONE SODIUM 250 MG IJ SOLR *I*
250.0000 mg | Freq: Once | INTRAMUSCULAR | Status: AC
Start: 2017-04-03 — End: 2017-04-03
  Administered 2017-04-03: 250 mg via INTRAVENOUS
  Filled 2017-04-03: qty 250

## 2017-04-03 MED ORDER — SODIUM CHLORIDE 0.9 % FLUSH FOR PUMPS *I*
0.0000 mL/h | INTRAVENOUS | Status: DC | PRN
Start: 2017-04-03 — End: 2017-04-04

## 2017-04-03 MED ORDER — AZITHROMYCIN 250 MG PO TABS *I*
1000.0000 mg | ORAL_TABLET | Freq: Once | ORAL | Status: AC
Start: 2017-04-03 — End: 2017-04-03
  Administered 2017-04-03: 1000 mg via ORAL
  Filled 2017-04-03: qty 4

## 2017-04-03 MED ORDER — CEFTRIAXONE SODIUM 250 MG IJ SOLR *I*
250.0000 mg | Freq: Once | INTRAMUSCULAR | Status: DC
Start: 2017-04-03 — End: 2017-04-03

## 2017-04-03 MED ORDER — DEXTROSE 5 % FLUSH FOR PUMPS *I*
0.0000 mL/h | INTRAVENOUS | Status: DC | PRN
Start: 2017-04-03 — End: 2017-04-04

## 2017-04-03 MED ORDER — SODIUM CHLORIDE 0.9 % IV BOLUS *I*
1000.0000 mL | Freq: Once | Status: AC
Start: 2017-04-03 — End: 2017-04-03
  Administered 2017-04-03: 1000 mL via INTRAVENOUS

## 2017-04-03 NOTE — ED Notes (Addendum)
Assumed care of PT. Pt stated that she had intercourse on 04/01/17 and after she was having cramping and pain in her vagina, 9/10 pain. The pain didn't go away and she was feeling nauseated, vomited, and diarrhea. She is dizzy when she stands up and it hurts to press on her abdomen. Denies having menses.

## 2017-04-03 NOTE — ED Provider Notes (Signed)
History     Chief Complaint   Patient presents with    Abdominal Pain     Patient is a 32 year old female with a past medical history of ectopic pregnancy x 2 who presents emergency Department with pelvic pain.  She states that she had intercourse on 1/1 and developed lower abdominal cramping at that time.  She states that she had odorous discharge, she frequently gets BV and had a refill at the pharmacy so started taking topical Flagyl yesterday.  She states that she was still having lower abdominal pain and was concerned due to her history of ectopic pregnancies.  She states that she took a home pregnancy test that was negative.  Denies any vaginal bleeding.  She also reports diarrhea for the last 2 days, no fevers or chills.            Medical/Surgical/Family History     Past Medical History:   Diagnosis Date    Abnormal Pap smear     Anemia     Irregular periods/menstrual cycles         Patient Active Problem List   Diagnosis Code    Caries K02.9    Impacted third molar tooth K01.1    Abnormal Pap smear of cervix R87.619    Smoking trying to quit Z72.0            Past Surgical History:   Procedure Laterality Date    ECTOPIC PREGNANCY SURGERY  2009    OVARY REMOVAL      07/2007 with ectopic    PELVIC LAPAROSCOPY      10/2007 - [facility]     Family History   Problem Relation Age of Onset    Breast cancer Neg Hx     Cancer Neg Hx     Eclampsia Neg Hx     Diabetes Neg Hx     Colon cancer Neg Hx     Ovarian cancer Neg Hx     Hypertension Neg Hx     Preterm labor Neg Hx     Spont abortions Neg Hx     Stroke Neg Hx     Genetic Disorders Neg Hx     Thrombosis Neg Hx           Social History   Substance Use Topics    Smoking status: Former Smoker     Packs/day: 0.30     Years: 8.00     Types: Cigarettes     Quit date: 07/28/2012    Smokeless tobacco: Never Used    Alcohol use 1.8 oz/week     3 Cans of beer per week      Comment: socially     Living Situation     Questions Responses    Patient lives  with Alone    Homeless No    Caregiver for other family member     External Services     Employment Unemployed    Domestic Violence Risk                 Review of Systems   Review of Systems   Constitutional: Negative for chills and fever.   Respiratory: Negative for shortness of breath.    Cardiovascular: Negative for chest pain.   Gastrointestinal: Positive for abdominal pain, diarrhea and nausea. Negative for vomiting.   Genitourinary: Positive for pelvic pain and vaginal discharge. Negative for dysuria, frequency, genital sores, hematuria and vaginal bleeding.       Physical  Exam     Triage Vitals  Triage Start: Start, (04/03/17 1436)   First Recorded BP: 145/81, Resp: 18, Temp: 36.5 C (97.7 F) Oxygen Therapy SpO2: 99 %, Heart Rate: 81, (04/03/17 1438)  .  First Pain Reported  0-10 Scale: 9, Pain Location/Orientation: (S) Vagina (Radiating to buttocks), Pain Descriptors: (S) Cramping, (04/03/17 1438)       Physical Exam   Constitutional: She is oriented to person, place, and time. She appears well-developed. No distress.   HENT:   Head: Normocephalic and atraumatic.   Eyes: Conjunctivae are normal.   Neck: Normal range of motion. Neck supple.   Cardiovascular: Normal rate, regular rhythm and normal heart sounds.    Pulmonary/Chest: Effort normal and breath sounds normal.   Abdominal: Soft. Bowel sounds are normal. There is tenderness in the right lower quadrant, suprapubic area and left lower quadrant. There is no rigidity, no rebound and no guarding.   Genitourinary: Vagina normal. Pelvic exam was performed with patient supine. Uterus is tender. Cervix exhibits motion tenderness and discharge. Right adnexum displays no mass and no fullness. Left adnexum displays no mass and no fullness.   Musculoskeletal: Normal range of motion.   Neurological: She is alert and oriented to person, place, and time.   Skin: Skin is warm and dry.   Nursing note and vitals reviewed.      Medical Decision Making      Amount and/or  Complexity of Data Reviewed  Clinical lab tests: ordered and reviewed        Initial Evaluation:  ED First Provider Contact     Date/Time Event User Comments    04/03/17 1629 ED First Provider Contact Nannette Zill, [facility] Initial Face to Face Provider Contact          Patient seen by me on arrival date of 04/03/2017.    Assessment:  32 y.o.female comes to the ED with pelvic pain with vaginal discharge, diarrhea.      Differential Diagnosis includes:  Pregnancy, ectopic pregnancy, pelvic inflammatory disease, STI, viral syndrome, gastroenteritis.    Plan: CBC, BMP, RUQ, UA, preg, cultures    Labs Reviewed   CBC AND DIFFERENTIAL - Abnormal; Notable for the following:        Result Value    MCV 78 (*)     All other components within normal limits   CHLAMYDIA PLASMID DNA AMPLIFICATION   N. GONORRHOEAE DNA AMPLIFICATION   VAGINITIS SCREEN: DNA PROBE   BASIC METABOLIC PANEL   RUQ PANEL (ED ONLY)   POCT URINALYSIS DIPSTICK   POCT URINE PREGNANCY     Patient updated of their results, they are agreeable with plan for discharge receiving Azithromyacin and ceftriaxone and continuing her flagyl with follow up with their OBGYN. Return precautions discussed and they will return to the ED if their condition worsens.         Carmela Hurt, PA          Remonia Richter [place], [place]  04/03/17 [phone]

## 2017-04-03 NOTE — ED Notes (Signed)
ED RN INTERN ATTESTATION       I Fredric MareJoanne L Mitzy Naron, RN (RN) reviewed the following charting information by the RN intern: Vanessa KickJan, RN    Nursing Assessments  Medications  Plan of Care  Teaching   Notes    In the chart of Aneta MinsShaneika Sigmon (32 y.o. female) and attest to the charting being accurate.

## 2017-04-03 NOTE — Discharge Instructions (Signed)
Your blood work was reassuring, your pregnancy test was negative. You were treated with antibiotics and your pelvic cultures are pending, results take approximately 2 days. Continue Metronidazole. Do not have any intercourse until you know your results and treatment is complete. Call your OBGYN on Monday to set up follow up appointment within a week. Monitor for fevers and increased pain and return to the ED if your condition worsens.

## 2017-04-03 NOTE — ED Triage Notes (Signed)
Pt c/o abd pain after new years eve festivities, alo emesis.       Triage Note   Luster LandsbergMichael A Nida Manfredi, RN

## 2017-04-04 LAB — VAGINITIS SCREEN: DNA PROBE: Vaginitis Screen:DNA Probe: 0

## 2017-04-05 LAB — CHLAMYDIA PLASMID DNA AMPLIFICATION: Chlamydia Plasmid DNA Amplification: 0

## 2017-04-05 LAB — N. GONORRHOEAE DNA AMPLIFICATION: N. gonorrhoeae DNA Amplification: 0

## 2017-09-14 ENCOUNTER — Other Ambulatory Visit
Admission: RE | Admit: 2017-09-14 | Discharge: 2017-09-14 | Disposition: A | Discharge status: Home / Self Care | Payer: PRIVATE HEALTH INSURANCE | Source: Ambulatory Visit | Attending: Registered Nurse | Admitting: Registered Nurse

## 2017-09-14 DIAGNOSIS — Z6838 Body mass index (BMI) 38.0-38.9, adult: Secondary | ICD-10-CM | POA: Insufficient documentation

## 2017-09-14 DIAGNOSIS — Z0184 Encounter for antibody response examination: Secondary | ICD-10-CM | POA: Insufficient documentation

## 2017-09-14 LAB — CBC AND DIFFERENTIAL
Baso # K/uL: 0.1 10*3/uL (ref 0.0–0.1)
Basophil %: 0.7 %
Eos # K/uL: 0.3 10*3/uL (ref 0.0–0.4)
Eosinophil %: 3.4 %
Hematocrit: 35 % (ref 34–45)
Hemoglobin: 11.7 g/dL (ref 11.2–15.7)
IMM Granulocytes #: 0 10*3/uL
IMM Granulocytes: 0.4 %
Lymph # K/uL: 4 10*3/uL — ABNORMAL HIGH (ref 1.2–3.7)
Lymphocyte %: 49.1 %
MCH: 27 pg/cell (ref 26–32)
MCHC: 34 g/dL (ref 32–36)
MCV: 80 fL (ref 79–95)
Mono # K/uL: 0.5 10*3/uL (ref 0.2–0.9)
Monocyte %: 6.6 %
Neut # K/uL: 3.2 10*3/uL (ref 1.6–6.1)
Nucl RBC # K/uL: 0 10*3/uL (ref 0.0–0.0)
Nucl RBC %: 0 /100 WBC (ref 0.0–0.2)
Platelets: 282 10*3/uL (ref 160–370)
RBC: 4.3 MIL/uL (ref 3.9–5.2)
RDW: 14.2 % (ref 11.7–14.4)
Seg Neut %: 39.8 %
WBC: 8.1 10*3/uL (ref 4.0–10.0)

## 2017-09-14 LAB — BASIC METABOLIC PANEL
Anion Gap: 11 (ref 7–16)
CO2: 26 mmol/L (ref 20–28)
Calcium: 9.6 mg/dL (ref 8.8–10.2)
Chloride: 100 mmol/L (ref 96–108)
Creatinine: 0.7 mg/dL (ref 0.51–0.95)
GFR,Black: 132 *
GFR,Caucasian: 114 *
Glucose: 99 mg/dL (ref 60–99)
Lab: 13 mg/dL (ref 6–20)
Potassium: 4.1 mmol/L (ref 3.3–5.1)
Sodium: 137 mmol/L (ref 133–145)

## 2017-09-14 LAB — LIPID PANEL
Chol/HDL Ratio: 2.7
Cholesterol: 197 mg/dL
HDL: 74 mg/dL
LDL Calculated: 97 mg/dL
Non HDL Cholesterol: 123 mg/dL
Triglycerides: 132 mg/dL

## 2017-09-14 LAB — TSH: TSH: 2.19 u[IU]/mL (ref 0.27–4.20)

## 2017-09-15 LAB — VARICELLA ZOSTER IGG AB: VZV IgG: POSITIVE

## 2017-09-15 LAB — HEPATITIS A,B,C,PANEL
HBV Core Ab: NEGATIVE
HBV S Ab Quant: 0 m[IU]/mL
HBV S Ab: NEGATIVE
HBV S Ag: NEGATIVE
Hep C Ab: NEGATIVE
Hepatitis A IGG: NEGATIVE

## 2017-09-15 LAB — HEMOGLOBIN A1C: Hemoglobin A1C: 5.5 %

## 2017-09-15 LAB — MEASLES IGG AB: Measles IgG: NEGATIVE

## 2017-09-15 LAB — RUBELLA ANTIBODY, IGG: Rubella IgG AB: POSITIVE

## 2017-09-15 LAB — MUMPS ANTIBODY, IGG: Mumps IgG: NEGATIVE

## 2017-11-08 ENCOUNTER — Encounter: Payer: Self-pay | Admitting: Obstetrics and Gynecology

## 2017-11-24 ENCOUNTER — Ambulatory Visit: Payer: PRIVATE HEALTH INSURANCE | Admitting: Obstetrics and Gynecology

## 2018-06-05 LAB — UNMAPPED LAB RESULTS
Basophil # (HT): 0.1 10 3/uL — NL (ref 0.0–0.2)
Basophil % (HT): 1 % — NL (ref 0–3)
Eosinophil # (HT): 0.1 10 3/uL — NL (ref 0.0–0.6)
Eosinophil % (HT): 1 % — NL (ref 0–5)
Hematocrit (HT): 39 % — NL (ref 35–47)
Hemoglobin (HGB) (HT): 12.8 g/dL — NL (ref 12.0–16.0)
Lymphocyte # (HT): 4.5 10 3/uL — NL (ref 1.0–4.8)
Lymphocyte % (HT): 51 % — ABNORMAL HIGH (ref 15–45)
MCHC (HT): 32.9 g/dL — NL (ref 31.0–37.5)
MCV (HT): 80 fL — NL (ref 80–100)
Mean Corpuscular Hemoglobin (MCH) (HT): 26.3 pg — NL (ref 26.0–34.0)
Monocyte # (HT): 0.3 10 3/uL — NL (ref 0.1–1.0)
Monocyte % (HT): 4 % — NL (ref 0–15)
Neutrophil # (HT): 3.8 10 3/uL — NL (ref 1.8–8.0)
Platelets (HT): 307 10 3/uL — NL (ref 150–450)
RBC (HT): 4.86 10 6/uL — NL (ref 3.80–5.20)
RDW (HT): 14 % — NL (ref 0.0–15.2)
Seg Neut % (HT): 43 % — ABNORMAL LOW (ref 45–75)
WBC (HT): 8.8 10 3/uL — NL (ref 4.0–11.0)

## 2018-10-17 NOTE — Progress Notes (Signed)
Video Visit     Location of Patient: home    Location of Telemedicine Provider: clinical office    Other participants in telemedicine encounter and roles:  none.    This is an established patient visit.    Reason for visit: Vaginitis      HPI  SUBJECTIVE:  This is a 33 y.o. [id number] female who is scheduled for televideo visit during COVID 19 pandemic who presents with complaints of vaginal odor, discharge described as white and thick, and intermittent itching and irritation, and some incomplete bladder emptying for 8 days. Denies pelvic pain, fevers, chills, or flank pain.  She denies additional vaginal or urinary symptoms.  She has had UTIs, and feels current symptoms are not consistent.  She is sexually active.  She has sex with single female partner and is in a mutually monogamous longterm relationship.  She does not have current concerns about STI exposure and she does desire STD testing today for peace of mind.  She did recently use a new body wash.  Does not douche.  She denies additional recent change in soaps, detergents, and other sanitary products that may contribute to her symptoms. She has not already attempted self treatment with OTC meds.  She is not a diabetic.   Patient's last menstrual period was 09/27/2018 (approximate).    The current method of family planning is none and she does not wish to be pregnant.  She has previously used the pill which she liked.  Would prefer not to have monthly menses.  She is a former smoker.  Past health history is significant for nothing contradictory for COCPs.  She denies personal history of clotting disorders, stroke, heart attack, diabetes, hypertension, breast cancer, liver disease, hyperlipidemia, or migraines with neurological symptoms.      Current Outpatient Medications   Medication    Loratadine (CLARITIN PO)    prenatal multivitamin (PRENAVITE) tablet    diphenhydrAMINE (BENADRYL) 25 MG tablet     No current facility-administered medications for this  visit.        Allergies   Allergen Reactions    Nickel Hives     Patient's problem list, allergies, and medications were reviewed and updated as appropriate.  Please see the EHR for full details.      OBJECTIVE:  Vital signs:  LMP 09/27/2018 (Approximate)   BMI:  There is no height or weight on file to calculate BMI.  General appearance: This is a well appearing women whom is well groomed and dressed and, in no acute distress.  Maintains eye contact and answers all questions appropriately.   No additional exam performed- televideo visit.      ASSESSMENT & PLAN:  1.  Contraceptive management.  After discussion of all contraceptive options available to her she elects to try COCPs. She was advised how and when to start (after onset of next menses).  Common side effects were reviewed, including irregular bleeding, headache, breast tenderness, and minimal weight gain, and that these can occur for the first 3-6 months, but typically resolve over time.  She was instructed to contact the clinic if troublesome symptoms should persist past this adjustment period.  Discussed ACHES and symptoms to watch for. She was given a handout on the birth control pill in her check out.  A prescription for extended cycle COCPs was escribed to her pharmacy.  A rx for prenatal vitamins was also prescribed.         2.  Vaginal symptoms. BV based on  symptoms today.  Plan to treat empirically with metrogel q hs x 5 nights.  Rx sent.  Tests for chlamydia, GC, trichomonas were ordered and directions to obtain reviewed. Shanon  will observe these symptoms, and report promptly any worsening or unexpected persistence.     3.  History of abnormal pap smear.  Over due for follow up.  She was advised to return in the next month for annual exam with papsmear and acknowledges understanding of plan.  At the end of the visit she was without further questions or concerns.    Consent was obtained from the patient to complete this video visit; including  the potential for financial liability.      Italia Wolfert Cherrie Gauze, NP

## 2018-10-18 ENCOUNTER — Encounter: Payer: Self-pay | Admitting: Primary Care

## 2018-10-18 ENCOUNTER — Ambulatory Visit: Payer: MEDICAID | Attending: Primary Care | Admitting: Primary Care

## 2018-10-18 DIAGNOSIS — N760 Acute vaginitis: Secondary | ICD-10-CM | POA: Insufficient documentation

## 2018-10-18 DIAGNOSIS — N949 Unspecified condition associated with female genital organs and menstrual cycle: Secondary | ICD-10-CM | POA: Insufficient documentation

## 2018-10-18 DIAGNOSIS — R399 Unspecified symptoms and signs involving the genitourinary system: Secondary | ICD-10-CM

## 2018-10-18 DIAGNOSIS — Z309 Encounter for contraceptive management, unspecified: Secondary | ICD-10-CM | POA: Insufficient documentation

## 2018-10-18 DIAGNOSIS — Z113 Encounter for screening for infections with a predominantly sexual mode of transmission: Secondary | ICD-10-CM | POA: Insufficient documentation

## 2018-10-18 MED ORDER — CLASSIC PRENATAL 28-0.8 MG PO TABS *I*
1.0000 | ORAL_TABLET | Freq: Every day | ORAL | 11 refills | Status: DC
Start: 2018-10-18 — End: 2018-10-20

## 2018-10-18 MED ORDER — LEVONORGEST-ETH ESTRAD 91-DAY 0.15-0.03 &0.01 MG PO TABS
1.0000 | ORAL_TABLET | Freq: Every day | ORAL | 2 refills | Status: DC
Start: 2018-10-18 — End: 2018-10-20

## 2018-10-18 MED ORDER — METRONIDAZOLE 0.75 % VA GEL *I*
1.0000 | Freq: Every evening | VAGINAL | 0 refills | Status: DC
Start: 2018-10-18 — End: 2018-10-20

## 2018-10-18 NOTE — Patient Instructions (Signed)
What is bacterial vaginosis?  Bacterial Vaginosis (BV) is the name of a condition in women where the normal balance of bacteria in the vagina is disrupted and replaced by an overgrowth of certain bacteria. It is sometimes accompanied by discharge, odor, pain, itching, or burning.    How common is bacterial vaginosis?  Bacterial Vaginosis (BV) is the most common vaginal infection in women of childbearing age. In the [place], BV is common in pregnant women.    How do people get bacterial vaginosis?  The cause of BV is not fully understood. BV is associated with an imbalance in the bacteria that are normally found in a woman’s vagina. The vagina normally contains mostly “good” bacteria, and fewer “harmful” bacteria. BV develops when there is an increase in harmful bacteria.   Not much is known about how women get BV. There are many unanswered questions about the role that harmful bacteria play in causing BV. Any woman can get BV. However, some activities or behaviors can upset the normal balance of bacteria in the vagina and put women at increased risk including:   • Having a new sex partner or multiple sex partners   • Douching  It is not clear what role sexual activity plays in the development of BV. Women do not get BV from toilet seats, bedding, swimming pools, or from touching objects around them. Women who have never had sexual intercourse may also be affected.    What are the signs and symptoms of bacterial vaginosis?  Women with BV may have an abnormal vaginal discharge with an unpleasant odor. Some women report a strong fish-like odor, especially after intercourse. Discharge, if present, is usually white or gray; it can be thin. Women with BV may also have burning during urination or itching around the outside of the vagina, or both. However, most women with BV report no signs or symptoms at all.    What are the complications of bacterial vaginosis?   In most cases, BV causes no complications. But there  are some serious risks from BV including:   • Having BV can increase a woman’s susceptibility to HIV infection if she is exposed to the HIV virus.  • Having BV increases the chances that an HIV-infected woman can pass HIV to her sex partner.  • Having BV has been associated with an increase in the development of an infection following surgical procedures such as a hysterectomy or an abortion.  • Having BV while pregnant may put a woman at increased risk for some complications of pregnancy, such as a preterm delivery.  • BV can increase a woman’s susceptibility to other STDs, such as herpes simplex virus (HSV), chlamydia and gonorrhea.    How does bacterial vaginosis affect a pregnant woman and her baby?   Pregnant women with BV more often have babies who are born premature or with low birth weight (low birth weight is less than 5.5 pounds).  The bacteria that cause BV can sometimes infect the uterus (womb) and fallopian tubes (tubes that carry eggs from the ovaries to the uterus). This type of infection is called pelvic inflammatory disease (PID). PID can cause infertility or damage the fallopian tubes enough to increase the future risk of ectopic pregnancy and infertility. Ectopic pregnancy is a life-threatening condition in which a fertilized egg grows outside the uterus, usually in a fallopian tube which can rupture.     How is bacterial vaginosis diagnosed?   A health care provider must examine the   vagina for signs of BV and perform laboratory tests on a sample of vaginal fluid to look for bacteria associated with BV.    What is the treatment for bacterial vaginosis?  Although BV will sometimes clear up without treatment, all women with symptoms of BV should be treated to avoid complications. Female partners generally do not need to be treated. However, BV may spread between female sex partners.  Treatment is especially important for pregnant women. All pregnant women who have ever had a premature delivery or low  birth weight baby should be considered for a BV examination, regardless of symptoms, and should be treated if they have BV. All pregnant women who have symptoms of BV should be checked and treated.  Some physicians recommend that all women undergoing a hysterectomy or abortion be treated for BV prior to the procedure, regardless of symptoms, to reduce their risk of developing an infection.  BV is treatable with antibiotics prescribed by a health care provider. Two different antibiotics are recommended as treatment for BV: metronidazole or clindamycin. Either can be used with non-pregnant or pregnant women, but the recommended dosages differ. Women with BV who are HIV-positive should receive the same treatment as those who are HIV-negative.  BV can recur after treatment.    How can bacterial vaginosis be prevented?   BV is not completely understood by scientists, and the best ways to prevent it are unknown. However, it is known that BV is associated with having a new sex partner or having multiple sex partners.  The following basic prevention steps can help reduce the risk of upsetting the natural balance of bacteria in the vagina and developing BV:  • Be abstinent.  • Limit the number of sex partners.  • Do not douche.  • Use all of the medicine prescribed for treatment of BV, even if the signs and symptoms go away.    FOR MORE INFORMATION:   Division of STD Prevention (DSTDP) Centers for Disease Control and Prevention  www.cdc.gov/std     CDC-INFO Contact Center 1-800-CDC-INFO (1-800-232-4636)   email: cdcinfo@cdc.gov     Website: www.cdc.gov     Reviewed 12/2009    Birth Control Pills                                                            [facility]            What are birth control pills? Birth control pills, also called oral contraceptives (OCPs), are taken every day to prevent pregnancy.     How do birth control pills work?    Pills are made from the female hormones, estrogen and progesterone. These  hormones work by stopping your ovary from releasing an egg each month. They also make your cervical mucus thicker, stopping sperm from getting to the egg. They do not protect against STDs.    How effective are pills?    When BCPs are used perfectly, only 1 in 100 women will get pregnant in one year. However, since no one is perfect at taking pills every day, there is a 9% risk of getting pregnant in a year while using the pill.    What may be done before I start using birth control pills?     Your doctor will ask you   about diseases and illnesses you have had in the past and check your blood pressure. A pelvic exam is not needed unless you are having a problem.     Your doctor will ask about medicines that you use, and should ask you if you smoke. Smoking increases your chances of a having a stroke, a heart attack, or a blood clot in your lungs. If you smoke and are 35 years or older, you should not take certain kinds of pills.    Do not use this medicine if you have breast or uterine cancer, liver disease, or certain types of headache (migraines with aura). You should not use the estrogen-containing pills if you have ever had a blood clot, heart attack, or stroke.    What are the types of birth control pills? The different types of OCPs include monophasic, multiphasic, progesterone-only, low-dose, and extended cycle OCPs. They have different amounts of estrogen and progesterone, side effects, risks, and schedules for taking them.      Monophasic birth control pills: These pills have the same amount of estrogen and progesterone in each active pill. They may help prevent sudden changes in your mood or feelings caused by changing hormone levels.      Mulltiphasic birth control pills: The level of hormones in these pills change like those in your body through the month. Each pill has different amounts of estrogen and progesterone depending on the day they will be taken. This helps you get the right amount of hormones  and helps prevent any unwanted side effects.     Progesterone-only birth control pills: These pills only contain progesterone, which prevents ovulation and thickens cervical mucus to block sperm. They may cause less side effects than other pills, and are safer for women with medical problems such as high blood pressure or blood clots.     Extended cycle or Continuous pills: these pills contain active pills for 3 months in a row - meaning you get period every 3 months instead of every month.      How do I take my birth control pills?     Start taking your pills on the first day of your period, or on the first Sunday after your period begins. If you start your first pill after your period, you may need a backup method for one week. You can also ask your provider about a Quick Start method when you can start taking the pills right away.    Progesterone-only pills can be used immediately after having a baby and while breastfeeding. Regular pills can be started 4-6 weeks after having a baby and while breastfeeding.    Take one pill from the pack every day. The last four to  seven pills in the 28-day pack are usually a different color than the rest of the pills. You may start a new pack after finishing the old one.    Pick a time of the day that is easy for you to take your pills. Taking them at the same time every day may help prevent bleeding, and makes you less likely to get pregnant.    If you miss one pill, take it as soon as you remember. Continue taking the remaining pills at your usual time. If you miss two pills in a row, take one as soon as you remember. Continue taking the remaining pills at your usual time. If you miss three pills in a row, call your provider. You may have to stop that pack of  pills, wait for your period, and start a new pack. Use a backup method to prevent pregnancy.  You may also need to take emergency contraception (EC) if you have been having unprotected sex and missed three pills,  especially if they were missed at the beginning of the pack.    What are the advantages of using birth control pills?     Birth control pills may help decrease bleeding and pain during your monthly period. They may also help prevent cancer of the uterus, ovaries and colon. They can also prevent ovarian cysts.    What are the disadvantages or side effects of using birth control pills?    You must remember to take a pill every day, which can be inconvenient and difficult to do for some people.    Most women do not have any side effects. You may have sudden changes in your mood when taking pills. You may also have bleeding in between periods, less frequent periods, vaginal dryness, and breast pain. Birth control pills with estrogen put you at a small increased risk for blood clots and stroke, especially if you smoke and are older than 35. However, you are much more likely to have a blood clot or stroke during pregnancy than when you take a birth control pill.    What medications interact with birth control pills?    Some antibiotics (rifampin or rifabutin). Most antibiotics do NOT interfere.  Some anti-seizure medications, but not all of them  Certain HIV medications, but not all of them  If you are found to be pregnant while you are taking birth control pills, they DO NOT cause abortion, miscarriage or birth defects.    How much do pills cost?    Depending on your insurance, pills may cost $9-60 per month. However, your insurance may be able to cover the cost completely.      You can find more information about birth control pills here:    www.bedsider.org     http://www.[facility].org/health-topics/birth-control/birth-control-pill-4228.ht

## 2018-10-20 ENCOUNTER — Telehealth: Payer: Self-pay

## 2018-10-20 MED ORDER — CLASSIC PRENATAL 28-0.8 MG PO TABS *I*
1.0000 | ORAL_TABLET | Freq: Every day | ORAL | 11 refills | Status: DC
Start: 2018-10-20 — End: 2023-03-16

## 2018-10-20 MED ORDER — LEVONORGEST-ETH ESTRAD 91-DAY 0.15-0.03 &0.01 MG PO TABS
1.0000 | ORAL_TABLET | Freq: Every day | ORAL | 2 refills | Status: DC
Start: 2018-10-20 — End: 2019-09-25

## 2018-10-20 MED ORDER — METRONIDAZOLE 0.75 % VA GEL *I*
1.0000 | Freq: Every evening | VAGINAL | 0 refills | Status: AC
Start: 2018-10-20 — End: 2018-10-25

## 2018-10-20 NOTE — Addendum Note (Signed)
Addended by: Precious Reel on: 10/20/2018 02:01 PM     Modules accepted: Orders

## 2018-10-20 NOTE — Addendum Note (Signed)
Addended byLaurey Morale on: 10/20/2018 02:17 PM     Modules accepted: Orders

## 2018-10-20 NOTE — Telephone Encounter (Signed)
Pt called requesting Rx from 7/21 to be sent to another pharmacy. She is having trouble with her insurance being accepted at the location they were sent to.      prenatal multivitamin (PRENAVITE) tablet     metroNIDAZOLE (METROGEL-VAGINAL) 0.75 % vaginal gel     Levonorgest-Eth Estrad 91-Day 0.15-0.03 &0.01 MG TABS     Please send to-   [facility] @ [place]

## 2018-11-18 ENCOUNTER — Telehealth: Payer: Self-pay

## 2018-11-18 NOTE — Telephone Encounter (Signed)
Patient calling with low back pain, dysuria, and urgency "bearly making it to the bathroom".  Patient calling in the afternoon on a Friday with no clinic availability  Patient advised to proceed to [facility] urgent care for prompt evaluation.  She is agreeable with plan

## 2018-11-25 ENCOUNTER — Other Ambulatory Visit
Admission: RE | Admit: 2018-11-25 | Discharge: 2018-11-25 | Disposition: A | Discharge status: Home / Self Care | Payer: Medicaid Other | Source: Ambulatory Visit

## 2018-11-25 ENCOUNTER — Encounter: Payer: Self-pay | Admitting: Obstetrics and Gynecology

## 2018-11-25 ENCOUNTER — Telehealth: Payer: Self-pay

## 2018-11-25 ENCOUNTER — Ambulatory Visit: Payer: Medicaid Other | Attending: Obstetrics and Gynecology | Admitting: Obstetrics and Gynecology

## 2018-11-25 VITALS — BP 122/63 | HR 81 | Ht 62.0 in | Wt 204.6 lb

## 2018-11-25 DIAGNOSIS — Z01419 Encounter for gynecological examination (general) (routine) without abnormal findings: Secondary | ICD-10-CM

## 2018-11-25 DIAGNOSIS — Z1331 Encounter for screening for depression: Secondary | ICD-10-CM
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
## 2018-11-25 NOTE — Telephone Encounter (Signed)
TC from patient stating she is just starting her period and she is scheduled for a pap smear today with NP Rosario-McCabe and wanted to know if she should still come in.    Writer spoke with provider who encouraged patient to keep the appointment.  This was communicated with patient, time of appointment confirmed, patient verbalized understanding.

## 2018-11-26 LAB — SYPHILIS SCREEN
Syphilis Screen: NEGATIVE
Syphilis Status: NONREACTIVE

## 2018-11-26 LAB — CHLAMYDIA PLASMID DNA AMPLIFICATION: Chlamydia Plasmid DNA Amplification: 0

## 2018-11-26 LAB — N. GONORRHOEAE DNA AMPLIFICATION: N. gonorrhoeae DNA Amplification: 0

## 2018-11-26 LAB — HIV 1&2 ANTIGEN/ANTIBODY: HIV 1&2 ANTIGEN/ANTIBODY: NONREACTIVE

## 2018-11-26 LAB — TRICHOMONAS DNA AMPLIFICATION: Trichomonas DNA amplification: POSITIVE — AB

## 2018-11-28 ENCOUNTER — Telehealth: Payer: Self-pay | Admitting: Obstetrics and Gynecology

## 2018-11-28 MED ORDER — METRONIDAZOLE 500 MG PO TABS *I*
ORAL_TABLET | ORAL | 0 refills | Status: DC
Start: 2018-11-28 — End: 2018-12-26

## 2018-11-28 NOTE — Telephone Encounter (Signed)
Please inform pt that she tested positive for trichomonas. Please notify pt this is a STD which requires treatment and partner notification . I have called in an antibiotic Flagyl. Review instructions on how to take medication and ETOH precautions. No vaginal intercourse until both partners have been treated x 1 week. Please schedule f/u OFV in 4-6 weeks to re-test.

## 2018-11-29 ENCOUNTER — Encounter: Payer: Self-pay | Admitting: Primary Care

## 2018-11-29 DIAGNOSIS — A599 Trichomoniasis, unspecified: Secondary | ICD-10-CM | POA: Insufficient documentation

## 2018-11-29 NOTE — Telephone Encounter (Signed)
No answer and unable to leave VM message.   Sent MyChart message to call office for results.

## 2018-11-29 NOTE — Telephone Encounter (Signed)
Please verify if pt reads my chart. If not please send letter to home.

## 2018-11-30 ENCOUNTER — Telehealth: Payer: Self-pay

## 2018-11-30 MED ORDER — METRONIDAZOLE 500 MG PO TABS *I*
2000.0000 mg | ORAL_TABLET | Freq: Once | ORAL | 0 refills | Status: AC
Start: 2018-11-30 — End: 2018-11-30

## 2018-11-30 MED ORDER — ONDANSETRON HCL 4 MG PO TABS *I*
4.0000 mg | ORAL_TABLET | Freq: Three times a day (TID) | ORAL | 0 refills | Status: DC | PRN
Start: 2018-11-30 — End: 2023-03-16

## 2018-11-30 NOTE — Telephone Encounter (Signed)
Pt was only able to keep 1 of the 4 tabs of Flagyl that she got, the rest she vomited and they were visible for her. Pt asking if there is any other way she can get it other than pills. If not pt is willing to give it another try, but will need a new RX. Writer will route to provider for advice.

## 2018-11-30 NOTE — Telephone Encounter (Signed)
Flagyl unfortunately is the only treatment for trichomonas. Rx zofran sent to take prior to flagyl (about 1/2 hr prior would be best) Rx flagyl resent. Advise pt to not take on an empty stomach. May swallow pills one at a time over the course of 1/2 an hour if that makes them more tolerable.

## 2018-12-01 LAB — GYN CYTOLOGY

## 2018-12-01 NOTE — Telephone Encounter (Signed)
Please call pt (see below for updated number) and scheduled test for re-infection in 3 months.

## 2018-12-01 NOTE — Telephone Encounter (Signed)
Writer left voicemail x 1 on new number provided. If patient returns call please assist patient with scheduling.

## 2018-12-08 NOTE — Telephone Encounter (Signed)
Writer spoke to patient. Patient is scheduled.   Writer signing encounter.

## 2018-12-23 ENCOUNTER — Ambulatory Visit: Payer: Medicaid Other | Admitting: Obstetrics and Gynecology

## 2018-12-23 ENCOUNTER — Telehealth: Payer: Self-pay

## 2018-12-23 ENCOUNTER — Encounter: Payer: Self-pay | Admitting: Obstetrics and Gynecology

## 2018-12-23 VITALS — BP 126/73 | HR 78 | Ht 62.0 in | Wt 201.1 lb

## 2018-12-23 DIAGNOSIS — B9689 Other specified bacterial agents as the cause of diseases classified elsewhere: Secondary | ICD-10-CM

## 2018-12-23 DIAGNOSIS — N760 Acute vaginitis: Secondary | ICD-10-CM

## 2018-12-23 LAB — POCT VAGINAL WET MOUNT
CLUE CELLS,POC: POSITIVE
KOH FOR FUNGUS: NEGATIVE
TRICHOMONAS, POC: NEGATIVE
WHIFF TEST: POSITIVE
YEAST,POC: NEGATIVE

## 2018-12-23 MED ORDER — METRONIDAZOLE 500 MG PO TABS *I*
500.0000 mg | ORAL_TABLET | Freq: Two times a day (BID) | ORAL | 0 refills | Status: DC
Start: 2018-12-23 — End: 2018-12-26

## 2018-12-23 NOTE — Progress Notes (Signed)
Darlene Villegas  OBSTETRICS AND GYNECOLOGY      [facility] Practice   Acute Visit    Subjective     CC: vaginal discharge    HPI:  Darlene Villegas is a 33 y.o. G62P0020 female who presents to clinic today complaining of white, thin, and malodorous vaginal discharge for 3 days. She describes the odor as fishy.  Patient does not have concerns for possible STD exposure.  She denies having new sexual partners but reports intercourse makes these symptoms worse.  She has had BV in the past and reports this feels the same.      She had + trich 8/28 and reports her partner was treated and they waited one week to have intercourse.  She is currently on OCPs for contraception.      She declines repeat STI testing.      ROS:  Constitutional: Negative for fever or chills  Cardiovascular: Denies chest pain   Respiratory: Denies shortness of breath or   Gastrointestinal: Denies nausea/vomiting, abdominal pain, and flank pain.  Neurologic: Denies headaches or vision changes.  Denies syncope.   Genitourinary: Denies dysuria.  Denies urinary frequency or urgency.  Denies hematuria.  Denies flank pain. + spotting     Past medical history, surgical history, family history, medications, and allergies have been reviewed and updated in eRecord.    Objective        Physical Exam:  Vitals:    12/23/18 1542   BP: 126/73   Pulse: 78   Weight: 91.2 kg (201 lb 1.6 oz)   Height: 1.575 m (5\' 2" )     Body mass index is 36.78 kg/m.   Patient's last menstrual period was 12/23/2018.    General: Pleasant, well-appearing female, in NAD  Mood/Affect: cooperative  Skin: color normal and no lesions noted  HEENT: Normocephalic, atraumatic.  Respiratory: Non labored on room air  Extremities: No edema  Pelvic:   Darlene Villegas was present for the following exam.  - External genitalia normal in appearance without lesion.    - Speculum exam reveals vagina with normal rugae, mucosa without lesion.  Mild amount of clear discharge noted,  non-adherent to vaginal walls.  Scant blood noted in the vaginal vault. Nulliparous cervix without lesion.  Scant cervical discharge noted.    Wet prep: positive clue cells and positive whiff test    Assessment and Plan     Darlene Villegas is a 33 y.o. G2P0020 woman with bacterial vaginosis    Bacterial vaginosis  - prescribed Flagyl 500mg  BID for 7 days    History of Trich  - Reviewed importance of partner being treated    Return to clinic 11/13 for visit with Dr. Cassandria Santee    D/w Dr. Orpah Clinton DO  OB/GYN PGY 2   [phone]

## 2018-12-23 NOTE — Telephone Encounter (Signed)
Darlene Villegas is calling with complaints of white, creamy and malodorous vaginal discharge for 1week.    She denies pelvic pain.     Patient does not have concerns for possible STD exposure.  She denies having new sexual partners.But she states that every time she has sex with her partner, she gets this foul smelling discharge.    Patient denies urinary symptoms.     Patient was offered and accepted appointment for today at 3:30pm with Dr Thornell Sartorius.    Patient Demonstrated good understanding of concepts   Reviewed visitor policy.Marland Kitchen

## 2018-12-26 ENCOUNTER — Other Ambulatory Visit: Payer: Self-pay | Admitting: Obstetrics and Gynecology

## 2018-12-26 ENCOUNTER — Telehealth: Payer: Self-pay

## 2018-12-26 DIAGNOSIS — N760 Acute vaginitis: Secondary | ICD-10-CM

## 2018-12-26 DIAGNOSIS — B9689 Other specified bacterial agents as the cause of diseases classified elsewhere: Secondary | ICD-10-CM

## 2018-12-26 MED ORDER — METRONIDAZOLE 0.75 % VA GEL *I*
1.0000 | Freq: Every day | VAGINAL | 0 refills | Status: AC
Start: 2018-12-26 — End: 2018-12-31

## 2018-12-26 NOTE — Telephone Encounter (Signed)
Called patient who is requesting a change from Flagyl to Metrogel as she cannot tolerate the pill form of treatment.

## 2018-12-26 NOTE — Telephone Encounter (Signed)
Incoming call from pt having concerns with medication (metronidazole). Pt wanted me to let nurses know she cant take medication and to give her a call as soon as possible.

## 2018-12-26 NOTE — Addendum Note (Signed)
Addended by: Precious Reel on: 12/26/2018 03:16 PM     Modules accepted: Orders

## 2019-02-01 ENCOUNTER — Encounter: Payer: Self-pay | Admitting: Obstetrics and Gynecology

## 2019-02-10 ENCOUNTER — Ambulatory Visit: Payer: Medicaid Other | Admitting: Obstetrics and Gynecology

## 2019-02-22 ENCOUNTER — Ambulatory Visit: Payer: Medicaid Other | Admitting: Obstetrics and Gynecology

## 2019-02-22 NOTE — Progress Notes (Deleted)
S:  Darlene Villegas was treated for BV on 12/23/18.  She returns to office for test of cure.  She reports ***    O:  There were no vitals taken for this visit.***        General:  NAD        Pelvic         Ext gen:  No masses, NT         Int gen:  Negative BSU, pink, rugated with*** discharge         Cervix: Mid, pink, os closed without discharge.            No results found for this or any previous visit (from the past 24 hour(s)).***    A:  ***    P:  ***          Reviewed risks/benefits of flu vaccine.  ***

## 2019-02-23 ENCOUNTER — Emergency Department (HOSPITAL_COMMUNITY): Payer: Medicaid Other

## 2019-02-23 ENCOUNTER — Emergency Department (HOSPITAL_COMMUNITY)
Admission: EM | Admit: 2019-02-23 | Discharge: 2019-02-23 | Disposition: A | Discharge status: Home / Self Care | Payer: Medicaid Other | Attending: Emergency Medicine | Admitting: Emergency Medicine

## 2019-02-23 ENCOUNTER — Other Ambulatory Visit: Payer: Self-pay

## 2019-02-23 ENCOUNTER — Encounter (HOSPITAL_COMMUNITY): Payer: Self-pay | Admitting: Obstetrics and Gynecology

## 2019-02-23 DIAGNOSIS — R519 Headache, unspecified: Secondary | ICD-10-CM

## 2019-02-23 DIAGNOSIS — G44209 Tension-type headache, unspecified, not intractable: Secondary | ICD-10-CM | POA: Insufficient documentation

## 2019-02-23 DIAGNOSIS — R590 Localized enlarged lymph nodes: Secondary | ICD-10-CM | POA: Insufficient documentation

## 2019-02-23 DIAGNOSIS — J029 Acute pharyngitis, unspecified: Secondary | ICD-10-CM | POA: Diagnosis present

## 2019-02-23 DIAGNOSIS — J020 Streptococcal pharyngitis: Secondary | ICD-10-CM | POA: Diagnosis not present

## 2019-02-23 LAB — CBC
HCT: 33.1 % — ABNORMAL LOW (ref 36.0–46.0)
Hemoglobin: 11.4 g/dL — ABNORMAL LOW (ref 12.0–15.0)
MCH: 27.6 pg (ref 26.0–34.0)
MCHC: 34.4 g/dL (ref 30.0–36.0)
MCV: 80.1 fL (ref 80.0–100.0)
Platelets: 262 10*3/uL (ref 150–400)
RBC: 4.13 MIL/uL (ref 3.87–5.11)
RDW: 14 % (ref 11.5–15.5)
WBC: 12.6 10*3/uL — ABNORMAL HIGH (ref 4.0–10.5)
nRBC: 0 % (ref 0.0–0.2)

## 2019-02-23 LAB — COMPREHENSIVE METABOLIC PANEL
ALT: 21 U/L (ref 0–44)
AST: 21 U/L (ref 15–41)
Albumin: 3.5 g/dL (ref 3.5–5.0)
Alkaline Phosphatase: 57 U/L (ref 38–126)
Anion gap: 12 (ref 5–15)
BUN: 10 mg/dL (ref 6–20)
CO2: 20 mmol/L — ABNORMAL LOW (ref 22–32)
Calcium: 8.7 mg/dL — ABNORMAL LOW (ref 8.9–10.3)
Chloride: 108 mmol/L (ref 98–111)
Creatinine, Ser: 0.79 mg/dL (ref 0.44–1.00)
GFR calc Af Amer: >60 mL/min (ref >60)
GFR calc non Af Amer: >60 mL/min (ref >60)
Glucose, Bld: 86 mg/dL (ref 70–99)
Potassium: 3.3 mmol/L — ABNORMAL LOW (ref 3.5–5.1)
Sodium: 140 mmol/L (ref 135–145)
Total Bilirubin: 0.3 mg/dL (ref 0.3–1.2)
Total Protein: 7.2 g/dL (ref 6.5–8.1)

## 2019-02-23 LAB — ACETAMINOPHEN LEVEL: Acetaminophen (Tylenol), Serum: <10 ug/mL — ABNORMAL LOW (ref 10–30)

## 2019-02-23 LAB — GROUP A STREP BY PCR: Group A Strep by PCR: DETECTED — AB

## 2019-02-23 LAB — SALICYLATE LEVEL: Salicylate Lvl: <7 mg/dL (ref 2.8–30.0)

## 2019-02-23 LAB — I-STAT BETA HCG BLOOD, ED (MC, WL, AP ONLY): I-stat hCG, quantitative: <5 m[IU]/mL (ref <5)

## 2019-02-23 LAB — MONONUCLEOSIS SCREEN: Mono Screen: NEGATIVE

## 2019-02-23 MED ORDER — PENICILLIN G BENZATHINE & PROC 1200000 UNIT/2ML IM SUSP: 2.4000 10*6.[IU] | Freq: Once | INTRAMUSCULAR | Status: DC

## 2019-02-23 MED ORDER — SODIUM CHLORIDE 0.9 % IV BOLUS (SEPSIS)
2000.0000 mL | Freq: Once | INTRAVENOUS | Status: AC
  Administered 2019-02-23: 2000 mL via INTRAVENOUS

## 2019-02-23 MED ORDER — MORPHINE SULFATE (PF) 4 MG/ML IV SOLN
4.0000 mg | Freq: Once | INTRAVENOUS | Status: AC
  Administered 2019-02-23: 4 mg via INTRAVENOUS
  Filled 2019-02-23: qty 1

## 2019-02-23 MED ORDER — CLINDAMYCIN HCL 150 MG PO CAPS: 150.0000 mg | ORAL_CAPSULE | Freq: Four times a day (QID) | ORAL | 0 refills | Status: AC

## 2019-02-23 MED ORDER — CLINDAMYCIN PHOSPHATE 600 MG/50ML IV SOLN
600.0000 mg | Freq: Once | INTRAVENOUS | Status: AC
  Administered 2019-02-23: 600 mg via INTRAVENOUS
  Filled 2019-02-23: qty 50

## 2019-02-23 MED ORDER — PENICILLIN G BENZATHINE 1200000 UNIT/2ML IM SUSP
1.2000 10*6.[IU] | Freq: Once | INTRAMUSCULAR | Status: AC
  Administered 2019-02-23: 1.2 10*6.[IU] via INTRAMUSCULAR
  Filled 2019-02-23: qty 2

## 2019-02-23 MED ORDER — DEXAMETHASONE SODIUM PHOSPHATE 10 MG/ML IJ SOLN
10.0000 mg | Freq: Once | INTRAMUSCULAR | Status: AC
  Administered 2019-02-23: 10 mg via INTRAVENOUS
  Filled 2019-02-23: qty 1

## 2019-02-23 MED ORDER — SODIUM CHLORIDE (PF) 0.9 % IJ SOLN
INTRAMUSCULAR | Status: AC
  Administered 2019-02-23: 10 mL
  Filled 2019-02-23: qty 50

## 2019-02-23 MED ORDER — IOHEXOL 300 MG/ML  SOLN
75.0000 mL | Freq: Once | INTRAMUSCULAR | Status: AC | PRN
  Administered 2019-02-23: 75 mL via INTRAVENOUS

## 2019-02-23 MED ORDER — PREDNISONE 10 MG PO TABS: 40.0000 mg | ORAL_TABLET | Freq: Every day | ORAL | 0 refills | Status: AC

## 2019-02-23 NOTE — ED Provider Notes (Signed)
[facility]-EMERGENCY DEPT Provider Note   CSN: [id number] Arrival date & time: 02/23/19  [id number]     History   Chief Complaint Chief Complaint  Patient presents with  . Otalgia  . Headache  . Sore Throat    HPI Jackie Daniels is a 33 y.o. female.     Patient is a 33 year old female with no past medical history presenting to the emergency department for sore throat, ear pain and headache.  Patient reports that she has had a headache off and on for about 6 months.  Reports that since last Thursday she began to have a fever with sore throat and left-sided ear pain.  Reports significant pain with swallowing and having to spit out her saliva.  Reports fever to 103.  Reports that she was tested for Covid on Monday and it was negative.  Reports that since Monday she has been taking excessive amounts of ibuprofen to help with the pain.  Reports that she took 100 capsules of 200 mg ibuprofen tablets since Monday.  She reports she has not had a bowel movement in 2 days.  Denies any nausea, vomiting, chest pain, shortness of breath, dysuria or decreased urine.  She denies trying to purposely overdosed on medications or hurt herself.  She denies any vision changes, numbness, tingling, change in hearing.     History reviewed. No pertinent past medical history.  There are no active problems to display for this patient.      OB History   No obstetric history on file.      Home Medications    Prior to Admission medications   Medication Sig Start Date End Date Taking? Authorizing Provider  acetaminophen (TYLENOL) 325 MG tablet Take 650 mg by mouth every 6 (six) hours as needed for mild pain, fever or headache.   Yes [provider]  ASHLYNA 0.15-0.03 &0.01 MG tablet Take 1 tablet by mouth daily. 12/30/18  Yes [provider]  loratadine (CLARITIN) 10 MG tablet Take 10 mg by mouth daily.   Yes [provider]  clindamycin (CLEOCIN) 150 MG  capsule Take 1 capsule (150 mg total) by mouth every 6 (six) hours for 10 days. 02/23/19 03/05/19  Arlyn DunningMcLean, Briant Angelillo A, PA-C  predniSONE (DELTASONE) 10 MG tablet Take 4 tablets (40 mg total) by mouth daily for 5 days. 02/23/19 02/28/19  Arlyn DunningMcLean, Bodhi Moradi A, PA-C    Family History No family history on file.  Social History Social History   Tobacco Use  . Smoking status: Not on file  Substance Use Topics  . Alcohol use: Not on file  . Drug use: Not Currently     Allergies   Nickel   Review of Systems Review of Systems  Constitutional: Positive for appetite change and fever. Negative for activity change, chills and fatigue.  HENT: Positive for ear pain, sore throat and trouble swallowing. Negative for congestion, dental problem, drooling, ear discharge, facial swelling, mouth sores, nosebleeds, rhinorrhea, sinus pain, tinnitus and voice change.   Eyes: Negative for photophobia, pain, redness and visual disturbance.  Respiratory: Negative for cough, chest tightness and shortness of breath.   Cardiovascular: Negative for chest pain.  Gastrointestinal: Positive for constipation. Negative for abdominal pain, anal bleeding, blood in stool, diarrhea, nausea and vomiting.  Endocrine: Negative for polyuria.  Genitourinary: Negative for decreased urine volume, dysuria and flank pain.  Musculoskeletal: Negative for arthralgias and back pain.  Skin: Negative for rash.  Neurological: Positive for headaches. Negative for dizziness, tremors, seizures,  syncope, speech difficulty, weakness and light-headedness.  Hematological: Does not bruise/bleed easily.     Physical Exam Updated Vital Signs BP 131/90   Pulse 95   Temp 98.6 F (37 C) (Oral)   Resp (!) 22   SpO2 97%   Physical Exam Vitals signs and nursing note reviewed.  Constitutional:      General: She is not in acute distress.    Appearance: Normal appearance. She is well-developed. She is not ill-appearing, toxic-appearing or  diaphoretic.  HENT:     Head: Normocephalic.     Right Ear: Tympanic membrane normal.     Left Ear: Tympanic membrane normal.     Nose: No congestion.     Mouth/Throat:     Mouth: Mucous membranes are moist. No oral lesions.     Pharynx: Uvula midline. Pharyngeal swelling, posterior oropharyngeal erythema and uvula swelling present. No oropharyngeal exudate.  Eyes:     General: No visual field deficit.    Extraocular Movements: Extraocular movements intact.     Right eye: Normal extraocular motion and no nystagmus.     Left eye: Normal extraocular motion and no nystagmus.     Conjunctiva/sclera: Conjunctivae normal.     Pupils: Pupils are equal, round, and reactive to light.  Neck:     Musculoskeletal: Normal range of motion.  Cardiovascular:     Rate and Rhythm: Normal rate and regular rhythm.  Pulmonary:     Effort: Pulmonary effort is normal.     Breath sounds: Normal breath sounds.  Abdominal:     General: Bowel sounds are normal.     Palpations: Abdomen is soft.  Lymphadenopathy:     Cervical: Cervical adenopathy present.  Skin:    General: Skin is warm and dry.     Findings: No erythema or rash.  Neurological:     Mental Status: She is alert.     Cranial Nerves: No cranial nerve deficit, dysarthria or facial asymmetry.     Sensory: No sensory deficit.     Motor: No weakness.  Psychiatric:        Mood and Affect: Mood normal.      ED Treatments / Results  Labs (all labs ordered are listed, but only abnormal results are displayed) Labs Reviewed  GROUP A STREP BY PCR - Abnormal; Notable for the following components:      Result Value   Group A Strep by PCR DETECTED (*)    All other components within normal limits  COMPREHENSIVE METABOLIC PANEL - Abnormal; Notable for the following components:   Potassium 3.3 (*)    CO2 20 (*)    Calcium 8.7 (*)    All other components within normal limits  CBC - Abnormal; Notable for the following components:   WBC 12.6 (*)     Hemoglobin 11.4 (*)    HCT 33.1 (*)    All other components within normal limits  ACETAMINOPHEN LEVEL - Abnormal; Notable for the following components:   Acetaminophen (Tylenol), Serum <10 (*)    All other components within normal limits  SALICYLATE LEVEL  MONONUCLEOSIS SCREEN  I-STAT BETA HCG BLOOD, ED ([facility], [facility], [facility] ONLY)    EKG EKG Interpretation  Date/Time:  Thursday February 23 2019 07:47:45 EST Ventricular Rate:  92 PR Interval:    QRS Duration: 74 QT Interval:  328 QTC Calculation: 406 R Axis:   55 Text Interpretation: Sinus rhythm Normal ECG No previous tracing Confirmed by Lajean Saver [phone]) on 02/23/2019 10:32:24 AM  Radiology Ct Head Wo Contrast  Result Date: 02/23/2019 CLINICAL DATA:  Headache and sore throat EXAM: CT HEAD WITHOUT CONTRAST TECHNIQUE: Contiguous axial images were obtained from the base of the skull through the vertex without intravenous contrast. COMPARISON:  None. FINDINGS: Brain: No evidence of acute infarction, hemorrhage, hydrocephalus, extra-axial collection or mass lesion/mass effect. Vascular: Negative for hyperdense vessel Skull: Negative Sinuses/Orbits: Negative Other: None IMPRESSION: Negative CT head Electronically Signed   By: Marlan Palau M.D.   On: 02/23/2019 11:54   Ct Soft Tissue Neck W Contrast  Result Date: 02/23/2019 CLINICAL DATA:  Pharyngeal abscess.  Sore throat in headache EXAM: CT NECK WITH CONTRAST TECHNIQUE: Multidetector CT imaging of the neck was performed using the standard protocol following the bolus administration of intravenous contrast. CONTRAST:  33mL OMNIPAQUE IOHEXOL 300 MG/ML  SOLN COMPARISON:  None. FINDINGS: Pharynx and larynx: Enlargement of the palatine tonsils bilaterally. Central ill-defined fluid collection measuring 10 mm in each tonsil. Epiglottis and larynx normal. Thickening of the soft palate. Salivary glands: No inflammation, mass, or stone. Thyroid: Negative Lymph nodes: 11 mm right level 2 lymph  node. Scattered subcentimeter posterior lymph nodes bilaterally. Left level 2 lymph node 12 mm. Vascular: Normal vascular enhancement. Limited intracranial: Negative Visualized orbits: Negative Mastoids and visualized paranasal sinuses: Negative Skeleton: Negative Upper chest: Lung apices clear bilaterally. Other: None IMPRESSION: Diffuse enlargement of the tonsils bilaterally with thickening of the soft palate. 10 mm ill-defined low-density areas in both tonsils compatible with partially developed abscess. Mild reactive adenopathy in the neck. Electronically Signed   By: Marlan Palau M.D.   On: 02/23/2019 11:52    Procedures Procedures (including critical care time)  Medications Ordered in ED Medications  dexamethasone (DECADRON) injection 10 mg (10 mg Intravenous Given 02/23/19 1200)  penicillin g benzathine (BICILLIN LA) 1200000 UNIT/2ML injection 1.2 Million Units (1.2 Million Units Intramuscular Given 02/23/19 1200)  sodium chloride (PF) 0.9 % injection (10 mLs  Given 02/23/19 1253)  iohexol (OMNIPAQUE) 300 MG/ML solution 75 mL (75 mLs Intravenous Contrast Given 02/23/19 1127)  clindamycin (CLEOCIN) IVPB 600 mg (600 mg Intravenous New Bag/Given 02/23/19 1252)  sodium chloride 0.9 % bolus 2,000 mL (2,000 mLs Intravenous New Bag/Given 02/23/19 1252)  morphine 4 MG/ML injection 4 mg (4 mg Intravenous Given 02/23/19 1253)     Initial Impression / Assessment and Plan / ED Course  I have reviewed the triage vital signs and the nursing notes.  Pertinent labs & imaging results that were available during my care of the patient were reviewed by me and considered in my medical decision making (see chart for details).  Clinical Course as of Feb 22 1405  Thu Feb 23, 2019  1234 Patient presenting with significant throat pain and fever since last Thursday. Tolerating liquids here but reports not eating/drinking well due to pain. Labs revealing minimal elevated in WBC to 12.6, +strep and Ct showing  developing abscess in bilateral tonsils. I spoke with ENT physician for this patient he advised 2 liters of fluid, pain control and clindamycin and could be d/c with strict return precautions. Patient in agreement with plan. Tolerating PO. Additionally poison control initially called because patient had unintentionally taken a significant amount of tylenol since Monday due to her pain. Workup in regards to overdose was negative and patient denies SI, HI   [KM]    Clinical Course User Index [KM] Arlyn Dunning, PA-C       Based on review of vitals, medical screening exam, lab work  and/or imaging, there does not appear to be an acute, emergent etiology for the patient's symptoms. Counseled pt on good return precautions and encouraged both PCP and ED follow-up as needed.  Prior to discharge, I also discussed incidental imaging findings with patient in detail and advised appropriate, recommended follow-up in detail.  Clinical Impression: 1. Strep pharyngitis   2. Nonintractable headache, unspecified chronicity pattern, unspecified headache type     Disposition: Discharge  Prior to providing a prescription for a controlled substance, I independently reviewed the patient's recent prescription history on the [place] Controlled Substance Reporting System. The patient had no recent or regular prescriptions and was deemed appropriate for a brief, less than 3 day prescription of narcotic for acute analgesia.  This note was prepared with assistance of Conservation officer, historic buildings. Occasional wrong-word or sound-a-like substitutions may have occurred due to the inherent limitations of voice recognition software.   Final Clinical Impressions(s) / ED Diagnoses   Final diagnoses:  Strep pharyngitis  Nonintractable headache, unspecified chronicity pattern, unspecified headache type    ED Discharge Orders         Ordered    clindamycin (CLEOCIN) 150 MG capsule  Every 6 hours      02/23/19 1242    predniSONE (DELTASONE) 10 MG tablet  Daily     02/23/19 1242           Jeral Pinch 02/23/19 1406    Cathren Laine, MD 02/23/19 1446

## 2019-02-23 NOTE — ED Notes (Signed)
Per Poison control keep patient 4-6 hours with basic labs, EKG, ASA and tylenol level.

## 2019-02-23 NOTE — ED Notes (Signed)
Patient reports she has taken almost 2 bottles of ibuprofen in the last 24 hours. 200mg  each at 100 capsules per bottle.

## 2019-02-23 NOTE — Discharge Instructions (Addendum)
You were seen today for sore throat, ear pain and headache. It appears you have a very bad case of strep throat with possible developing abscess on your tonsils. You were given treatment for this. You were given penicillin, decadron and clindamycin. I have sent additional prescriptions to the pharmacy that you can pick up and start today or tomorrow. Drink PLENTY of water to stay hydrated. This is important. Your head scan was normal. Thank you for allowing me to care for you today. Please return to the emergency department if you have new or worsening symptoms, especially if you are unable to keep down any fluids. Take your medications as instructed.

## 2019-02-23 NOTE — ED Triage Notes (Signed)
Pt reports to the ED with complaints of left sided ear pain, constipation, sore throat and headache.  Pateint reports she has been taking quite a bit of ibuprofen for headaches without success. Patient reports cannot r/o pregnancy as she and her boyfriend do not use condoms. 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

## 2019-06-13 ENCOUNTER — Encounter: Payer: Self-pay | Admitting: Obstetrics and Gynecology

## 2019-06-14 ENCOUNTER — Telehealth: Payer: Self-pay

## 2019-06-14 DIAGNOSIS — N898 Other specified noninflammatory disorders of vagina: Secondary | ICD-10-CM

## 2019-06-14 MED ORDER — METRONIDAZOLE 0.75 % VA GEL *I*
1.0000 | Freq: Every evening | VAGINAL | 0 refills | Status: AC
Start: 2019-06-14 — End: 2019-06-19

## 2019-06-14 NOTE — Telephone Encounter (Signed)
rx for metrogel sent.   "yeast" symptoms are clumpy, cottage cheese discharge that typically occurs on day 3 - explained this is normal w/ metrogel use and if she is not having itching then most likely not a yeast infection. Pt ok w/ waiting on diflucan rx. Will call if develops itching.

## 2019-06-14 NOTE — Telephone Encounter (Signed)
Issue addressed in separate phone call.

## 2019-06-14 NOTE — Telephone Encounter (Signed)
Called patient in response to her mychart message. SHe has been having BV symptoms for almost 2 weeks. It started with discharge and is now experiencing odor.    Chief Complaint: Acute Vaginitis    Subjective:      Darlene Villegas is a 34 y.o. female who called the office with concerns related to abnormal vaginal discharge.     Symptoms have been present for 2 weeks.     Vaginal symptoms: vaginal symptoms of perineal odor.  She denies burning, sores and growths.      Sexually active: yes No LMP recorded. Occasional spotting on continuous OCPs Contraception: OCPs. Possible pregnancy: no    Last STI screening in the last 3 months? no Any new partners since last STI screening? no Sexually transmitted infection risk: very low risk of STD exposure.     Does patient request an office visit?: no (if so, please assess acuity/appropriateness for phone visit) Patient has very limited availability as she is in school and works.    If a provider determines treatment is indicated, is patient requesting oral or topical vaginal treatment? metrogel for BV and also requesting Diflucan as she usually get yeast infection following treatment    Are you experiencing a cough, fever, difficulty breathing that is new, body aches and/or a sore throat? no    Have you had exposure to anyone with known Coronavirus that you are aware of? no    Additional Information:     Fever > 100.5'F: no  Antibiotic treatment in last 2 weeks: no  Irregular vaginal bleeding: no  Diabetes: no  Use of immunosuppressive medication: no    Exclusions to phone treatment: (please schedule)   pregnancy + bleeding   fever + pelvic pain (no respiratory symptoms)  Plan:     The following were reviewed with the patient:     One time order sent to provider to treat vaginitis symptoms. Provider will determine treatment based on symptoms and patient will be notified of plan.   If symptoms do not resolve within 7 days of treatment, patient should call the office  again.   Call the office or seek urgent care with new or worsening symptoms.

## 2019-07-05 ENCOUNTER — Encounter: Payer: Self-pay | Admitting: Obstetrics and Gynecology

## 2019-08-17 ENCOUNTER — Telehealth: Payer: Self-pay

## 2019-08-17 ENCOUNTER — Ambulatory Visit: Payer: Medicaid Other | Admitting: Obstetrics and Gynecology

## 2019-08-17 NOTE — Telephone Encounter (Signed)
Chief Complaint: Acute Vaginitis    Subjective:      Darlene Villegas is a 34 y.o. female who called the office with concerns related to abnormal vaginal discharge.     Symptoms have been present for 3days.     Vaginal symptoms: vaginal discharge white, malodorous and creamy.  She denies burning, itching, painful perineum, sores, growths, swollen perineum and painful intercourse.      Sexually active: Yes No LMP recorded. Contraception: OCPs. Possible pregnancy: no    Last STI screening in the last 3 months? No Any new partners since last STI screening? no Sexually transmitted infection risk: possible STD exposure.     Does patient request an office visit?: yes  She would also like STI testing    If a provider determines treatment is indicated, is patient requesting oral or topical vaginal treatment? Topical    Are you experiencing a cough, fever, difficulty breathing that is new, body aches and/or a sore throat? no    Have you had exposure to anyone with known Coronavirus that you are aware of? no    Additional Information:     Fever > 100.5'F: no  Antibiotic treatment in last 2 weeks: no  Irregular vaginal bleeding: no  Diabetes: no  Use of immunosuppressive medication: no    Exclusions to phone treatment: (please schedule)   pregnancy + bleeding   fever + pelvic pain (no respiratory symptoms)  Plan:     Appointment scheduled for 5pm today with [facility] [employer]

## 2019-09-23 ENCOUNTER — Other Ambulatory Visit: Payer: Self-pay | Admitting: Primary Care

## 2019-10-24 ENCOUNTER — Other Ambulatory Visit
Admission: RE | Admit: 2019-10-24 | Discharge: 2019-10-24 | Disposition: A | Discharge status: Home / Self Care | Payer: Medicaid Other | Source: Ambulatory Visit | Attending: Physician Assistant | Admitting: Physician Assistant

## 2019-10-24 ENCOUNTER — Other Ambulatory Visit: Payer: Self-pay

## 2019-10-24 DIAGNOSIS — L259 Unspecified contact dermatitis, unspecified cause: Secondary | ICD-10-CM | POA: Insufficient documentation

## 2019-10-24 LAB — CBC AND DIFFERENTIAL
Baso # K/uL: 0.1 10*3/uL (ref 0.0–0.1)
Basophil %: 1 %
Eos # K/uL: 0.1 10*3/uL (ref 0.0–0.4)
Eosinophil %: 1.8 %
Hematocrit: 37 % (ref 34–45)
Hemoglobin: 12.8 g/dL (ref 11.2–15.7)
IMM Granulocytes #: 0 10*3/uL (ref 0.0–0.0)
IMM Granulocytes: 0.6 %
Lymph # K/uL: 2.1 10*3/uL (ref 1.2–3.7)
Lymphocyte %: 30.1 %
MCH: 27 pg (ref 26–32)
MCHC: 35 g/dL (ref 32–36)
MCV: 78 fL — ABNORMAL LOW (ref 79–95)
Mono # K/uL: 0.6 10*3/uL (ref 0.2–0.9)
Monocyte %: 8.6 %
Neut # K/uL: 4 10*3/uL (ref 1.6–6.1)
Nucl RBC # K/uL: 0 10*3/uL (ref 0.0–0.0)
Nucl RBC %: 0 /100 WBC (ref 0.0–0.2)
Platelets: 282 10*3/uL (ref 160–370)
RBC: 4.7 MIL/uL (ref 3.9–5.2)
RDW: 13.8 % (ref 11.7–14.4)
Seg Neut %: 57.9 %
WBC: 6.8 10*3/uL (ref 4.0–10.0)

## 2019-10-24 LAB — COMPREHENSIVE METABOLIC PANEL
ALT: 37 U/L — ABNORMAL HIGH (ref 0–35)
AST: 53 U/L — ABNORMAL HIGH (ref 0–35)
Albumin: 4.5 g/dL (ref 3.5–5.2)
Alk Phos: 45 U/L (ref 35–105)
Anion Gap: 13 (ref 7–16)
Bilirubin,Total: 0.8 mg/dL (ref 0.0–1.2)
CO2: 22 mmol/L (ref 20–28)
Calcium: 9.6 mg/dL (ref 8.8–10.2)
Chloride: 103 mmol/L (ref 96–108)
Creatinine: 0.74 mg/dL (ref 0.51–0.95)
GFR,Black: 122 *
GFR,Caucasian: 105 *
Glucose: 95 mg/dL (ref 60–99)
Lab: 11 mg/dL (ref 6–20)
Potassium: 4.3 mmol/L (ref 3.3–5.1)
Sodium: 138 mmol/L (ref 133–145)
Total Protein: 6.9 g/dL (ref 6.3–7.7)

## 2019-10-24 LAB — SEDIMENTATION RATE, AUTOMATED: Sedimentation Rate: <=1 mm/hr (ref 0–20)

## 2019-10-24 MED ORDER — TRIAMCINOLONE ACETONIDE 0.1 % EX LOTN *I*
TOPICAL_LOTION | CUTANEOUS | 0 refills | Status: DC
Start: 2019-10-24 — End: 2019-11-01
  Filled 2019-10-24: qty 60, 30d supply, fill #0

## 2019-10-24 MED ORDER — HYDROXYZINE HCL 25 MG PO TABS *I*
25.0000 mg | ORAL_TABLET | Freq: Three times a day (TID) | ORAL | 0 refills | Status: DC | PRN
Start: 2019-10-24 — End: 2023-03-16
  Filled 2019-10-24: qty 21, 7d supply, fill #0

## 2019-10-25 LAB — LUPUS ANTICOAGULANT: Lupus Anticoagulant: NEGATIVE

## 2019-10-25 LAB — SYPHILIS SCREEN
Syphilis Screen: NEGATIVE
Syphilis Status: NONREACTIVE

## 2019-10-25 LAB — HEPATITIS A,B,C,PANEL
HBV Core Ab: NEGATIVE
HBV S Ab Quant: 0.37 m[IU]/mL
HBV S Ab: NEGATIVE
HBV S Ag: NEGATIVE
Hep C Ab: NEGATIVE
Hepatitis A IGG: NEGATIVE

## 2019-10-25 LAB — HIV 1&2 ANTIGEN/ANTIBODY: HIV 1&2 ANTIGEN/ANTIBODY: NONREACTIVE

## 2019-10-25 LAB — REVIEWED BY:

## 2019-10-25 LAB — INTERPRETATION,SPEC COAG

## 2019-10-25 LAB — SPEC COAG REVIEW

## 2019-10-27 ENCOUNTER — Other Ambulatory Visit: Payer: Self-pay

## 2019-10-27 MED ORDER — METHYLPREDNISOLONE 4 MG PO TBPK *A*
ORAL_TABLET | ORAL | 0 refills | Status: DC
Start: 2019-10-27 — End: 2023-03-16
  Filled 2019-10-27: qty 21, 7d supply, fill #0

## 2019-11-01 ENCOUNTER — Other Ambulatory Visit: Payer: Self-pay

## 2019-11-01 MED ORDER — TRIAMCINOLONE ACETONIDE 0.1 % EX LOTN *I*
TOPICAL_LOTION | CUTANEOUS | 0 refills | Status: DC
Start: 2019-11-01 — End: 2019-11-24

## 2019-11-02 ENCOUNTER — Other Ambulatory Visit: Payer: Self-pay

## 2019-11-24 ENCOUNTER — Other Ambulatory Visit: Payer: Self-pay

## 2019-11-24 MED ORDER — TRIAMCINOLONE ACETONIDE 0.1 % EX LOTN *I*
TOPICAL_LOTION | CUTANEOUS | 0 refills | Status: DC
Start: 2019-11-23 — End: 2019-12-14
  Filled 2019-11-24: qty 60, 30d supply, fill #0

## 2019-11-28 ENCOUNTER — Other Ambulatory Visit: Payer: Self-pay

## 2019-11-30 NOTE — Progress Notes (Signed)
SUBJECTIVE     Darlene Villegas is a 34 y.o. female  who presents to the clinic for evaluation of a vaginal discharge.    HPI: Notes vaginal discharge with irritation and odor. She has noticed BV issues for the past year and treated in 11/2018 and twice this year by telephone management. She is hoping for Rx for gel to use more regularly for this issue. She denies changes to soaps, detergents or hygiene products. She denies any pelvic pain or fever.  She is sexually active and noticing this more after sex. . No LMP recorded. The current method of family planning is OCP.     Patient's medications, allergies, past medical, surgical, social and family histories were reviewed and updated as appropriate.    Review of Systems  GYN ROS:  --GU Sxs:            --Vag DC: Yes            --Dysuria: No            --Bleeding with sex: No  --Breast Sxs: None  --Endocrine Sxs.: None  OBJECTIVE      There were no vitals filed for this visit.  A [facility]) was present in the exam room at the time the pelvic exam was performed.  General appearance: 34 y.o. female. alert, cooperative and no distress  PELVIC:   Ext Gen: Normal female  Vagina: pink, moderate amount of thin, white  discharge  Cervix: no discharge, no CMT  Uterus: NSSC, NT  Adnexa: no palpable masses, non-tender  Wet prep: + clue, whiff and PH 6     ASSESSMENT AND PLAN   1. Routine screening for STI (sexually transmitted infection)  STI prevention and condom use reviewed. Consents for STI cervix cx.   - Trichomonas DNA amplification  - N. Gonorrhoeae DNA amplification  - Chlamydia plasmid DNA amplification    2. BV (bacterial vaginosis)/Recurrent vaginitis  Vaginitis prevention reviewed and written information provided. Condom use encouraged. May trial probiotics. Will treat initially with Metrogel and then start suppressive Metrogel therapy. She will call prn.   - POCT Wet Prep (order and result)  - metroNIDAZOLE (METROGEL-VAGINAL) 0.75 % vaginal gel; Use on  applicator in vagina at bedtime for 5 nights and the following week start on applicator in vagina at bedtime twice weekly for up to 6 months for suppressive therapy  Dispense: 140 g; Refill: 3    Patient able to teach back information given to her today.  Schedule Annual GYN exam or prn

## 2019-12-01 ENCOUNTER — Ambulatory Visit: Payer: Medicaid Other | Attending: Obstetrics and Gynecology | Admitting: Obstetrics and Gynecology

## 2019-12-01 ENCOUNTER — Encounter: Payer: Self-pay | Admitting: Obstetrics and Gynecology

## 2019-12-01 VITALS — BP 135/82 | HR 74 | Ht 63.0 in | Wt 193.5 lb

## 2019-12-01 DIAGNOSIS — Z113 Encounter for screening for infections with a predominantly sexual mode of transmission: Secondary | ICD-10-CM

## 2019-12-01 DIAGNOSIS — B9689 Other specified bacterial agents as the cause of diseases classified elsewhere: Secondary | ICD-10-CM | POA: Insufficient documentation

## 2019-12-01 DIAGNOSIS — N760 Acute vaginitis: Secondary | ICD-10-CM | POA: Insufficient documentation

## 2019-12-01 MED ORDER — METRONIDAZOLE 0.75 % VA GEL *I*
VAGINAL | 3 refills | Status: DC
Start: 2019-12-01 — End: 2023-03-16

## 2019-12-01 NOTE — Patient Instructions (Signed)
Patient Education   Bacterial Vaginosis   WHAT YOU NEED TO KNOW:   Bacterial vaginosis is an infection in the vagina. It may cause vaginitis (irritation and inflammation of the vagina). The cause is not known. Bacteria normally found in the vagina are imbalanced. Your risk increases if you are sexually active, you use a douche, or you have an intrauterine device (IUD).     DISCHARGE INSTRUCTIONS:   Call your doctor or gynecologist if:   · Your symptoms come back or do not improve with treatment.    · You have vaginal bleeding that is not your monthly period.    · You have questions or concerns about your condition or care.    Medicines:   · Antibiotics  are given to kill the bacteria. They may be given as a pill or a cream to put in your vagina.    · Take your medicine as directed.  Contact your healthcare provider if you think your medicine is not helping or if you have side effects. Tell him of her if you are allergic to any medicine. Keep a list of the medicines, vitamins, and herbs you take. Include the amounts, and when and why you take them. Bring the list or the pill bottles to follow-up visits. Carry your medicine list with you in case of an emergency.    Bacterial vaginosis and pregnancy:  If you have bacterial vaginosis during pregnancy, your baby may be born early or have a low birth weight. Your healthcare provider may recommend testing for bacterial vaginosis before or during your pregnancy. He or she will talk to you about your risk for premature delivery, and make sure you know the benefits and risks of testing.  Prevent bacterial vaginosis:   · Keep your vaginal area clean and dry.  Wear underwear and pantyhose with a cotton crotch. Wipe from front to back after you urinate or have a bowel movement. After you bathe, rinse soap from your vaginal area to decrease your risk for irritation.    · Do not use products that cause irritation.  Always use unscented tampons or sanitary pads. Do not use feminine  sprays, powders, or scented tampons. They may cause irritation and increase your risk for vaginosis. Detergents and fabric softeners may also cause irritation.    · Do not use a douche.  This can cause an imbalance in healthy vaginal bacteria.    · Use latex condoms during sex.  This helps prevent another infection and keeps your partner from getting the infection.    Follow up with your doctor or gynecologist as directed:  Bacterial vaginosis increases the risk for several health problems, such as pelvic inflammatory disease (PID) or sexually transmitted infections. Work with your healthcare providers to schedule regular appointments to check for health problems. Write down your questions so you remember to ask them during your visits.  © Copyright [employer] 2020 Information is for End User's use only and may not be sold, redistributed or otherwise used for commercial purposes. All illustrations and images included in CareNotes® are the copyrighted property of [employer] Inc[employer] or [employer]  The above information is an educational aid only. It is not intended as medical advice for individual conditions or treatments. Talk to your doctor, nurse or pharmacist before following any medical regimen to see if it is safe and effective for you.

## 2019-12-02 LAB — CHLAMYDIA PLASMID DNA AMPLIFICATION: Chlamydia Plasmid DNA Amplification: 0

## 2019-12-02 LAB — TRICHOMONAS DNA AMPLIFICATION: Trichomonas DNA amplification: 0

## 2019-12-02 LAB — N. GONORRHOEAE DNA AMPLIFICATION: N. gonorrhoeae DNA Amplification: 0

## 2019-12-07 IMAGING — CT CT NECK W/ CM
3 series · 10 of 33 positions shown, 11 images · IV contrast (omnipaque)
Comparison: None.

CLINICAL DATA: Pharyngeal abscess.  Sore throat in headache

EXAM:
CT NECK WITH CONTRAST
TECHNIQUE: Multidetector CT imaging of the neck was performed using the
standard protocol following the bolus administration of intravenous
contrast.
CONTRAST:  75mL OMNIPAQUE IOHEXOL 300 MG/ML  SOLN

[Series 2: head wo · axial · 0.47mm/px · z∈[-134,-74]mm · 2 of 28 slices shown, 3 images]
[im 9/28  soft-tissue]
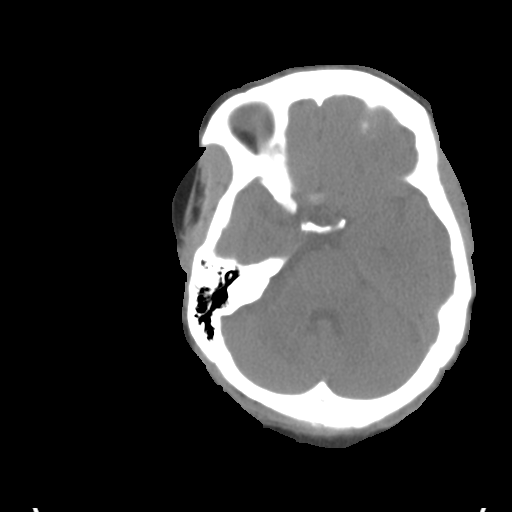
[im 9/28  bone]
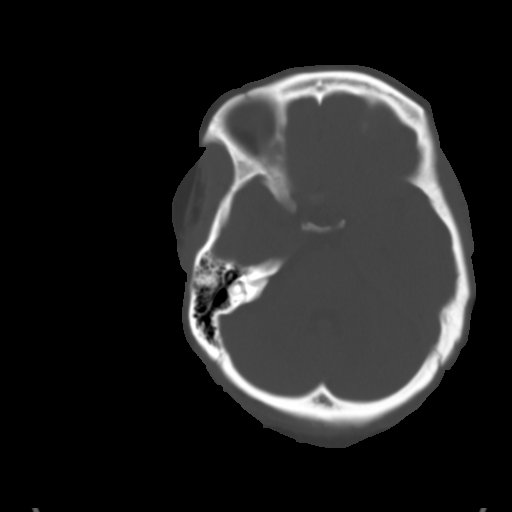
[im 21/28  bone]
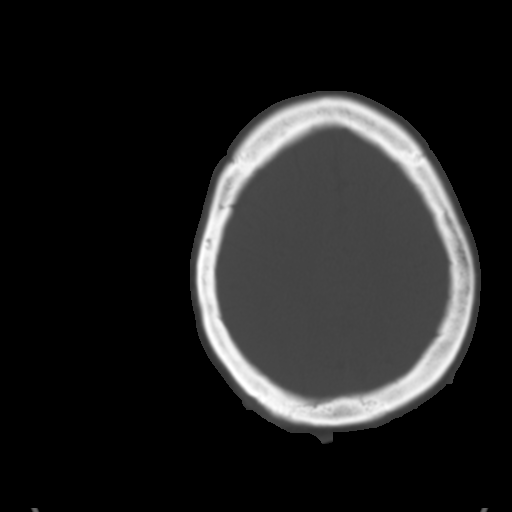

[Series 4: coronal soft tissue · coronal · 0.27mm/px · 3 of 67 slices shown]
[im 14/67  bone]
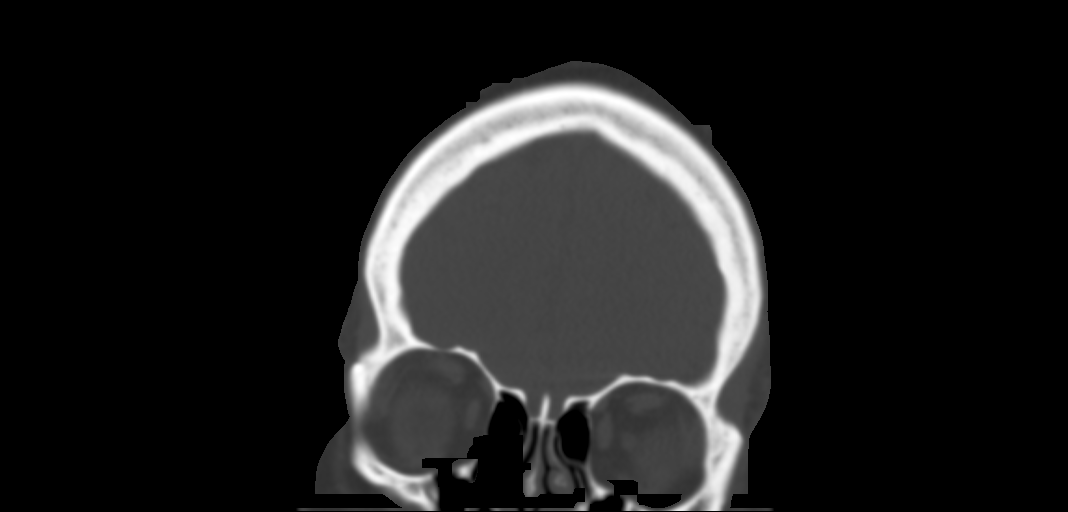
[im 27/67  bone]
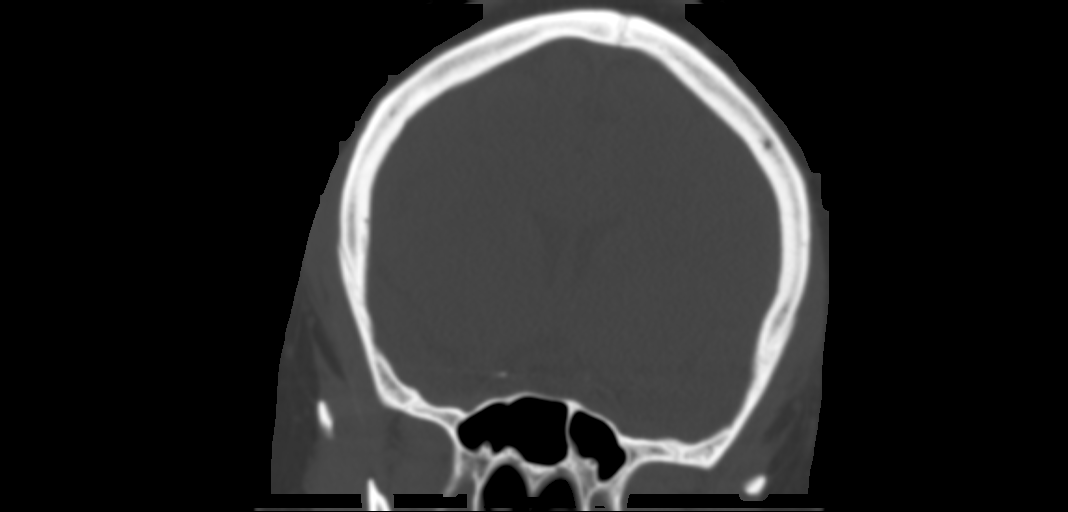
[im 40/67  bone]
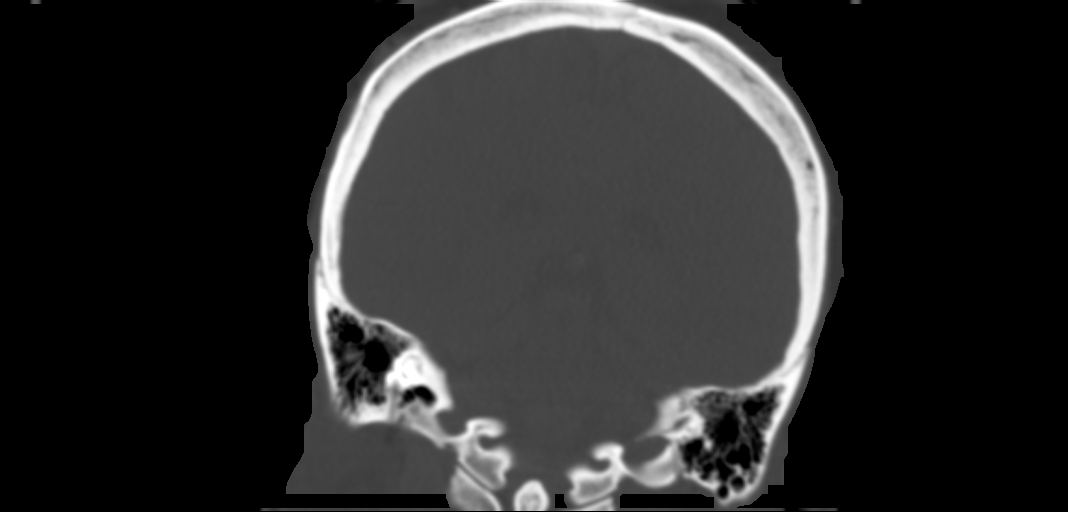

[Series 5: sagittal soft tissue · sagittal · 0.27mm/px · 5 of 57 slices shown]
[im 19/57  bone]
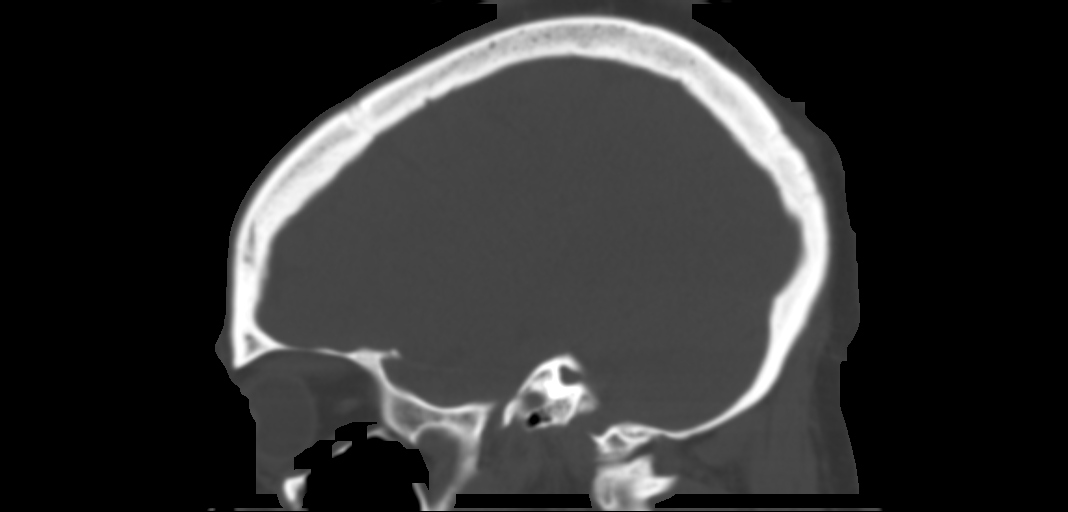
[im 24/57  bone]
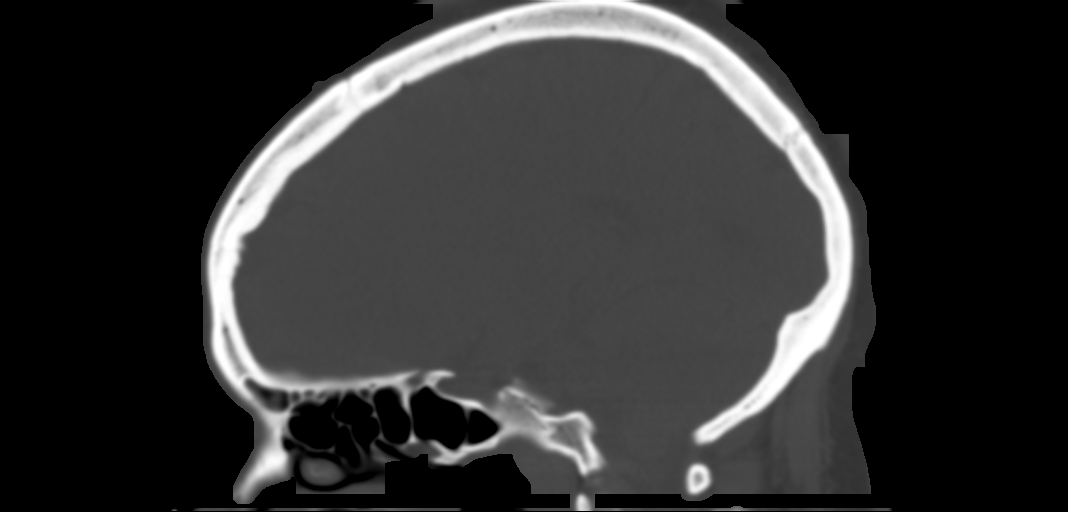
[im 29/57  bone]
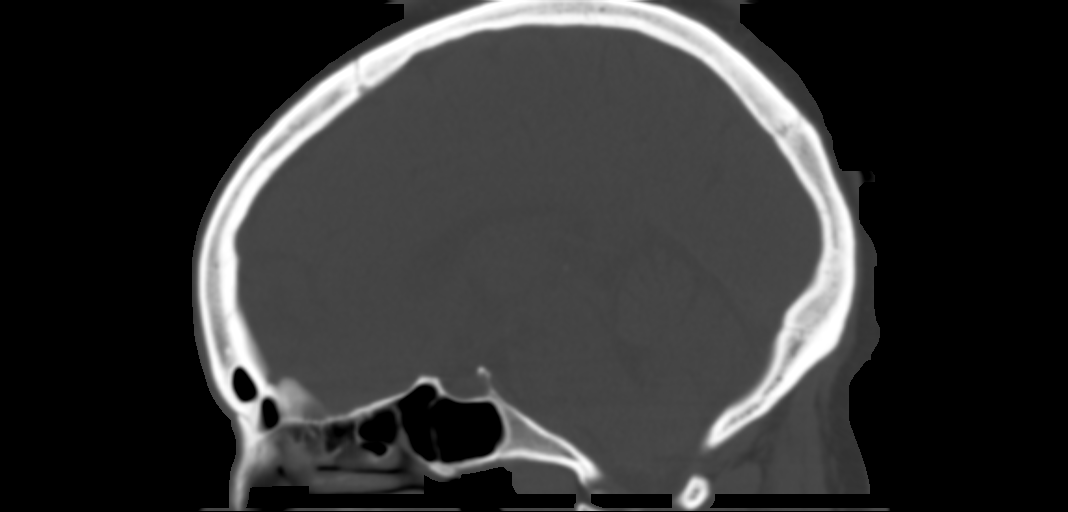
[im 33/57  bone]
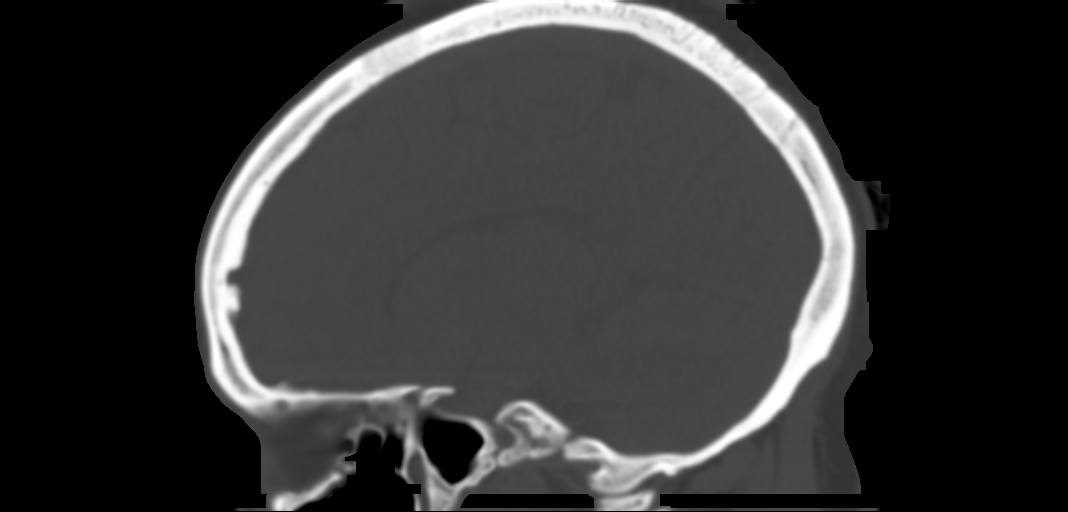
[im 38/57  bone]
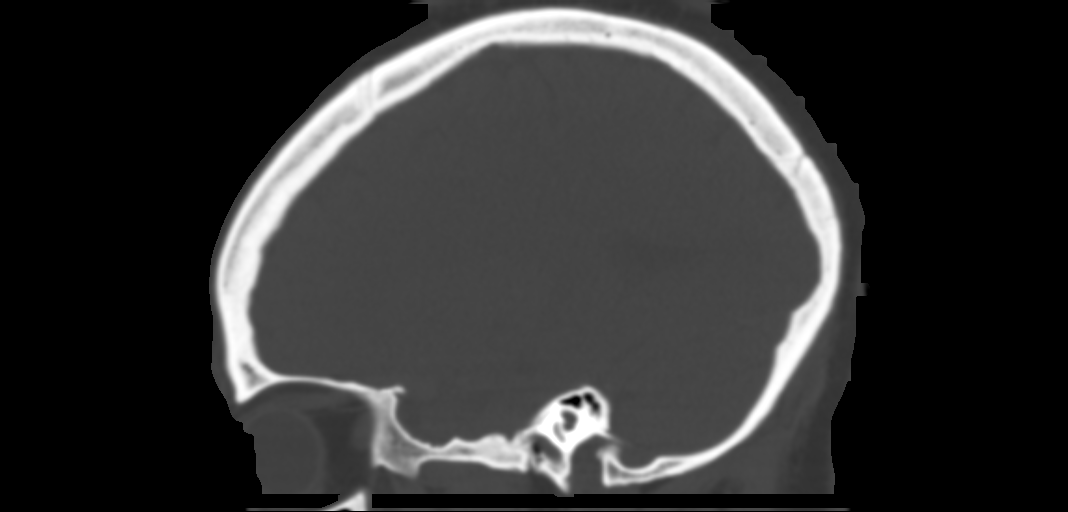

[10 of 33 positions shown; findings below may reference images not displayed]

FINDINGS: Pharynx and larynx: Enlargement of the palatine tonsils bilaterally.
Central ill-defined fluid collection measuring 10 mm in each tonsil.
Epiglottis and larynx normal. Thickening of the soft palate.

Salivary glands: No inflammation, mass, or stone.

Thyroid: Negative

Lymph nodes: 11 mm right level 2 lymph node. Scattered subcentimeter
posterior lymph nodes bilaterally. Left level 2 lymph node 12 mm.

Vascular: Normal vascular enhancement.

Limited intracranial: Negative

Visualized orbits: Negative

Mastoids and visualized paranasal sinuses: Negative

Skeleton: Negative

Upper chest: Lung apices clear bilaterally.

Other: None
IMPRESSION: Diffuse enlargement of the tonsils bilaterally with thickening of
the soft palate. 10 mm ill-defined low-density areas in both tonsils
compatible with partially developed abscess. Mild reactive
adenopathy in the neck.

## 2019-12-07 IMAGING — CT CT HEAD W/O CM
4 series · 17 of 35 positions shown, 20 images · IV contrast (OMNIPAQUE 300)
Comparison: None.

CLINICAL DATA: Headache and sore throat

EXAM:
CT HEAD WITHOUT CONTRAST
TECHNIQUE: Contiguous axial images were obtained from the base of the skull
through the vertex without intravenous contrast.

[Series 2: axial neck · axial · 0.42mm/px · z∈[-179,-109]mm · 3 of 88 slices shown]
[im 18/88  bone]
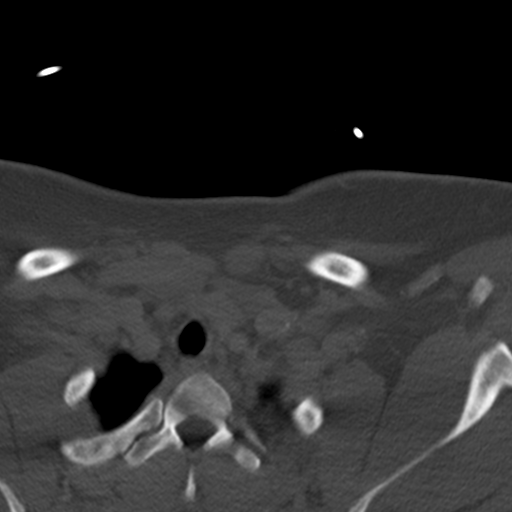
[im 35/88  bone]
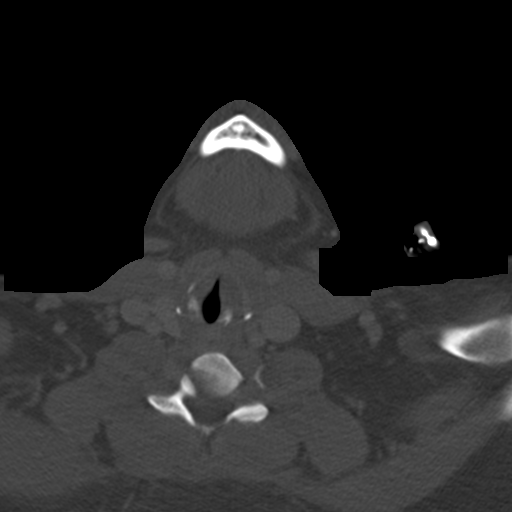
[im 53/88  bone]
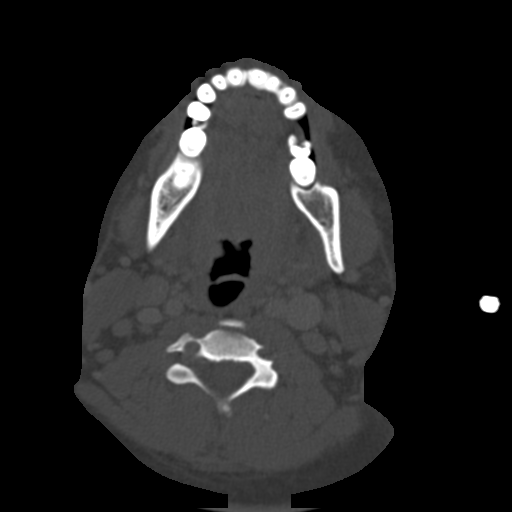

[Series 602: axial reformats · axial · 0.42mm/px · z∈[-253,-96]mm · 6 of 122 slices shown, 8 images]
[im 18/122  soft-tissue]
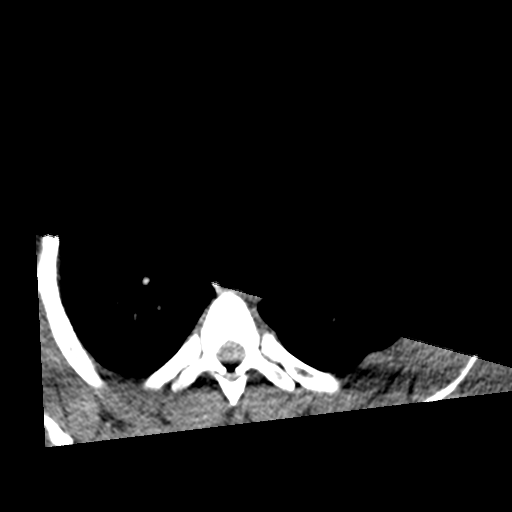
[im 18/122  bone]
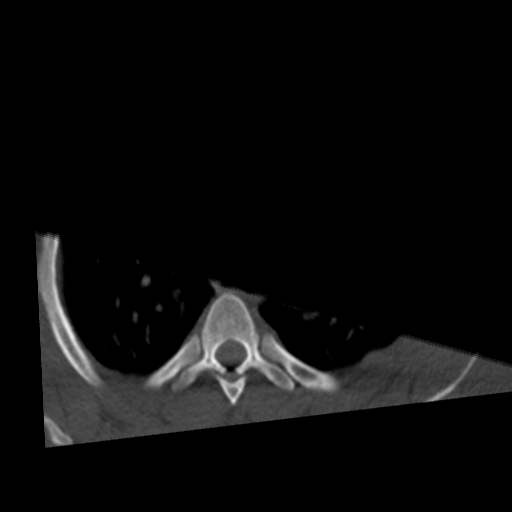
[im 35/122  bone]
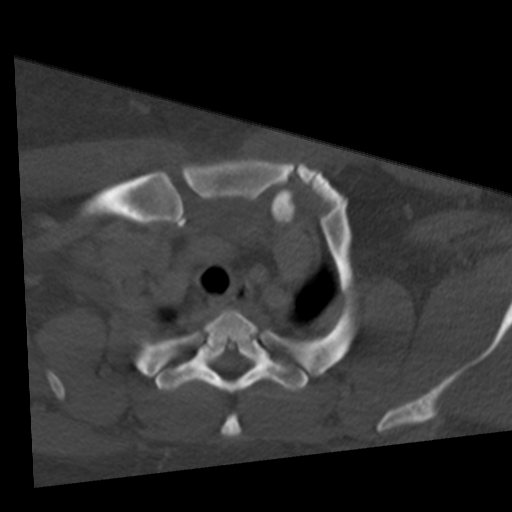
[im 52/122  bone]
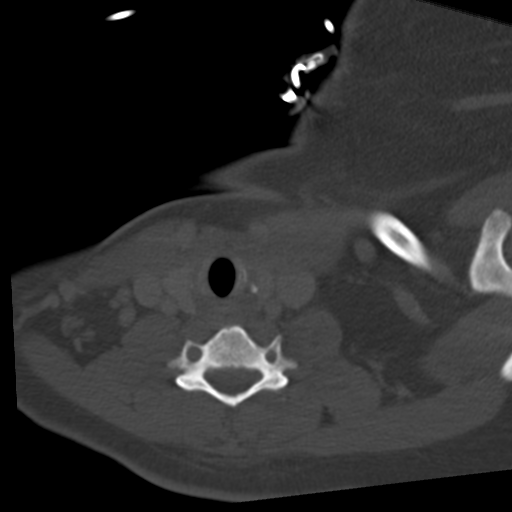
[im 70/122  bone]
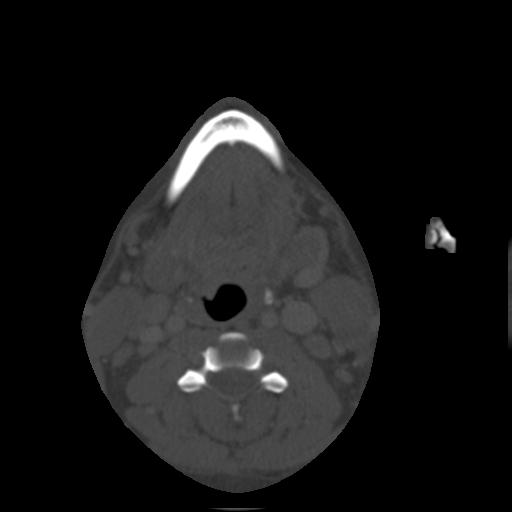
[im 87/122  soft-tissue]
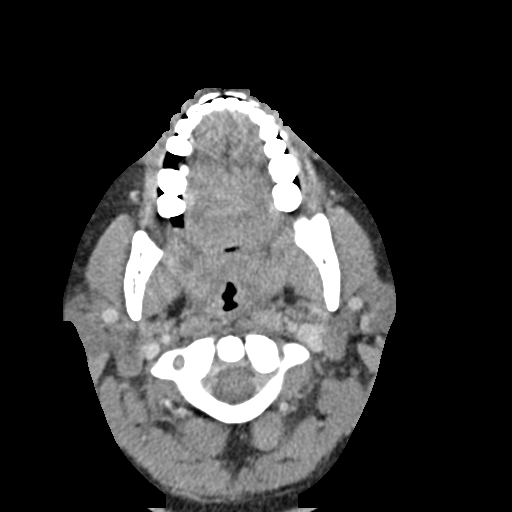
[im 87/122  bone]
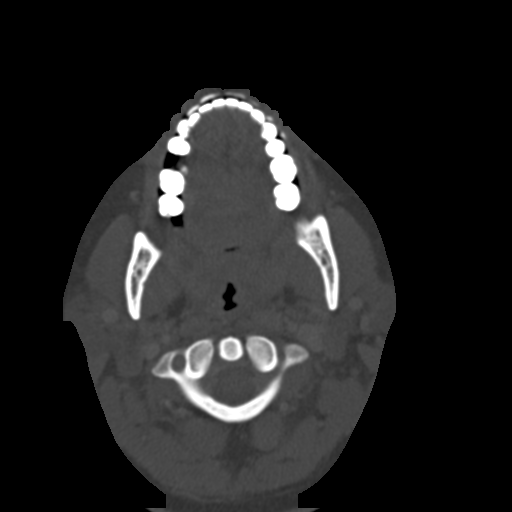
[im 104/122  bone]
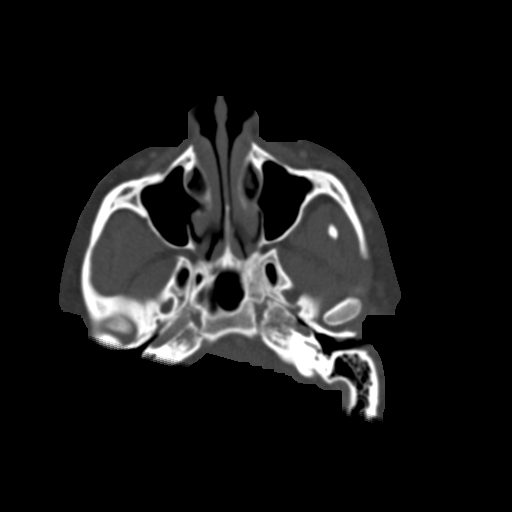

[Series 603: coronal images · coronal · 0.42mm/px · 3 of 104 slices shown]
[im 33/104  bone]
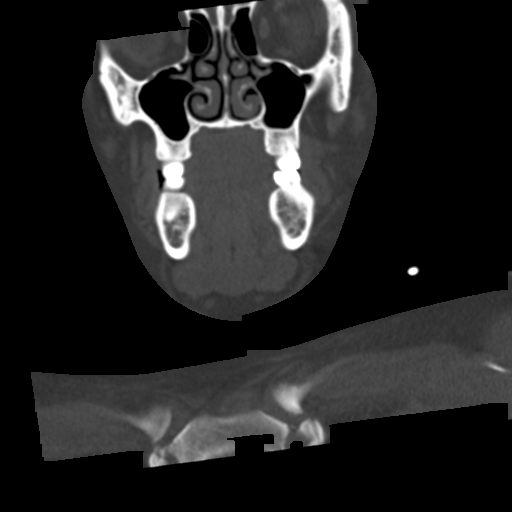
[im 46/104  bone]
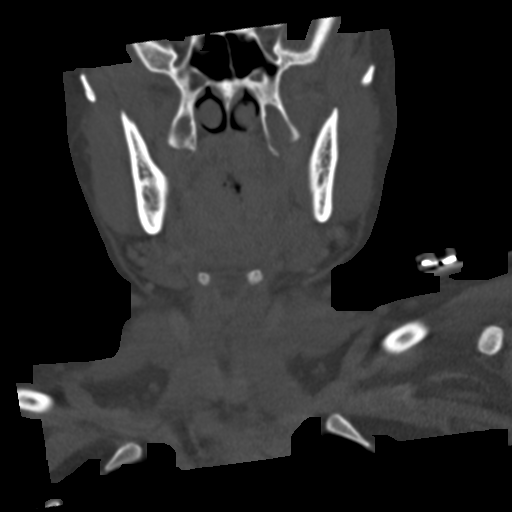
[im 58/104  bone]
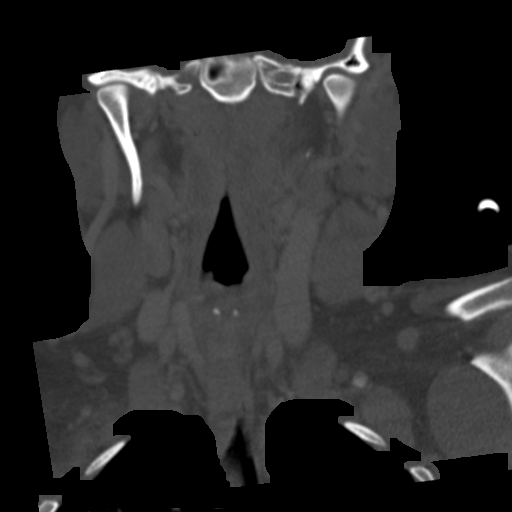

[Series 604: sagittal images · sagittal · 0.42mm/px · 5 of 115 slices shown, 6 images]
[im 39/115  bone]
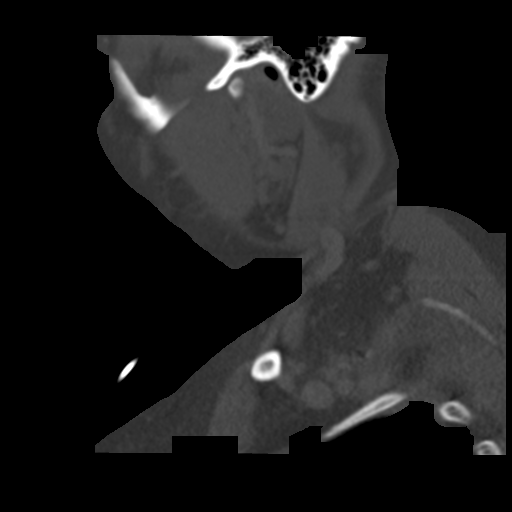
[im 48/115  bone]
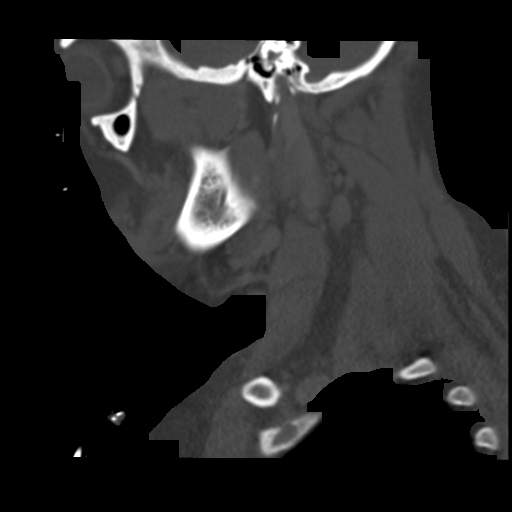
[im 58/115  soft-tissue]
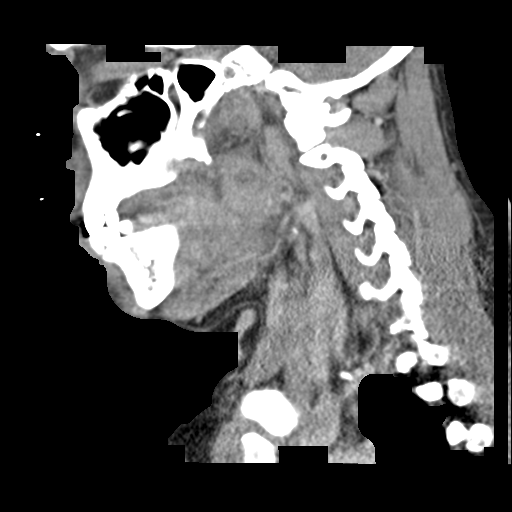
[im 58/115  bone]
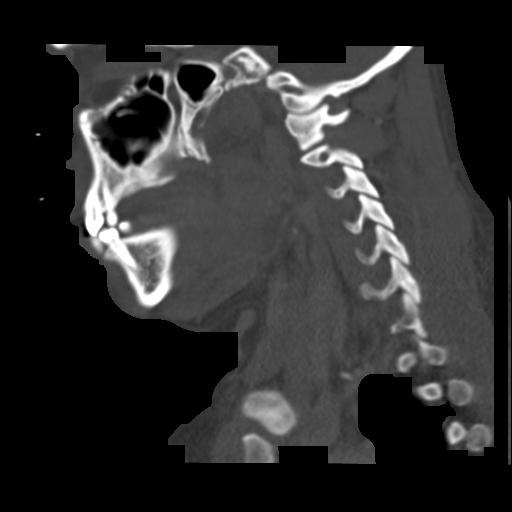
[im 67/115  bone]
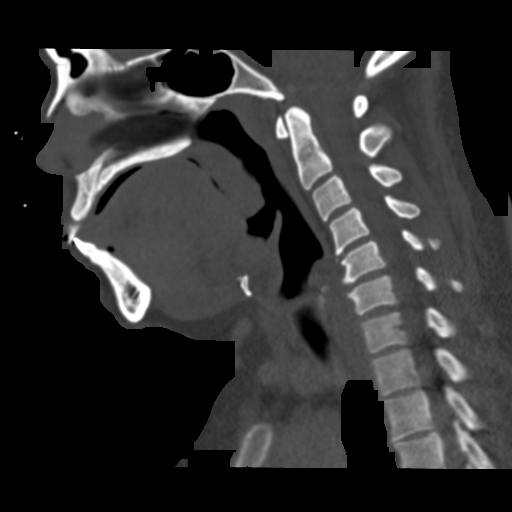
[im 77/115  bone]
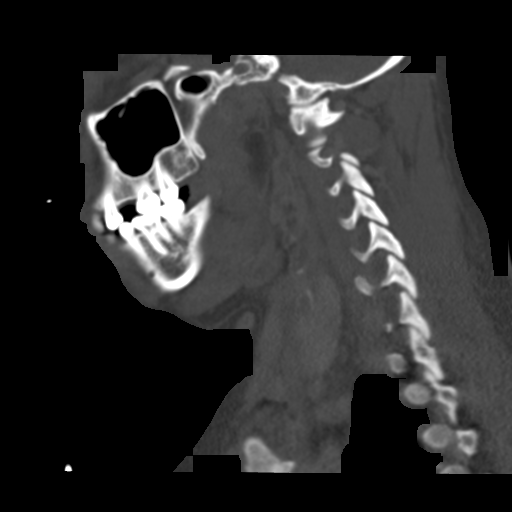

[17 of 35 positions shown; findings below may reference images not displayed]

FINDINGS: Brain: No evidence of acute infarction, hemorrhage, hydrocephalus,
extra-axial collection or mass lesion/mass effect.

Vascular: Negative for hyperdense vessel

Skull: Negative

Sinuses/Orbits: Negative

Other: None
IMPRESSION: Negative CT head

## 2019-12-14 ENCOUNTER — Other Ambulatory Visit: Payer: Self-pay

## 2019-12-14 MED ORDER — TRIAMCINOLONE ACETONIDE 0.1 % EX CREA *I*
TOPICAL_CREAM | CUTANEOUS | 0 refills | Status: DC
Start: 2019-12-13 — End: 2023-03-16
  Filled 2019-12-14: qty 80, 30d supply, fill #0

## 2020-03-11 ENCOUNTER — Other Ambulatory Visit: Payer: Self-pay | Admitting: Physician Assistant

## 2020-03-11 DIAGNOSIS — N760 Acute vaginitis: Secondary | ICD-10-CM

## 2020-03-11 DIAGNOSIS — B9689 Other specified bacterial agents as the cause of diseases classified elsewhere: Secondary | ICD-10-CM

## 2020-08-06 ENCOUNTER — Other Ambulatory Visit
Admission: RE | Admit: 2020-08-06 | Discharge: 2020-08-06 | Disposition: A | Discharge status: Home / Self Care | Payer: Medicaid Other | Source: Ambulatory Visit | Attending: Family Medicine | Admitting: Family Medicine

## 2020-08-06 DIAGNOSIS — R300 Dysuria: Secondary | ICD-10-CM | POA: Insufficient documentation

## 2020-08-07 ENCOUNTER — Other Ambulatory Visit: Payer: Self-pay

## 2020-08-08 LAB — AEROBIC CULTURE: Aerobic Culture: 0

## 2020-08-13 ENCOUNTER — Other Ambulatory Visit: Payer: Self-pay | Admitting: Gastroenterology

## 2020-08-13 ENCOUNTER — Other Ambulatory Visit
Admission: RE | Admit: 2020-08-13 | Discharge: 2020-08-13 | Disposition: A | Discharge status: Home / Self Care | Payer: Medicaid Other | Source: Ambulatory Visit | Attending: Physician Assistant | Admitting: Physician Assistant

## 2020-08-13 DIAGNOSIS — R81 Glycosuria: Secondary | ICD-10-CM | POA: Insufficient documentation

## 2020-08-13 LAB — CBC AND DIFFERENTIAL
Baso # K/uL: 0.1 10*3/uL (ref 0.0–0.1)
Basophil %: 1.1 %
Eos # K/uL: 0.1 10*3/uL (ref 0.0–0.4)
Eosinophil %: 1.1 %
Hematocrit: 36 % (ref 34–45)
Hemoglobin: 12.4 g/dL (ref 11.2–15.7)
IMM Granulocytes #: 0 10*3/uL (ref 0.0–0.0)
IMM Granulocytes: 0.3 %
Lymph # K/uL: 3.5 10*3/uL (ref 1.2–3.7)
Lymphocyte %: 47.9 %
MCH: 28 pg (ref 26–32)
MCHC: 35 g/dL (ref 32–36)
MCV: 81 fL (ref 79–95)
Mono # K/uL: 0.7 10*3/uL (ref 0.2–0.9)
Monocyte %: 10 %
Neut # K/uL: 2.9 10*3/uL (ref 1.6–6.1)
Nucl RBC # K/uL: 0 10*3/uL (ref 0.0–0.0)
Nucl RBC %: 0 /100 WBC (ref 0.0–0.2)
Platelets: 287 10*3/uL (ref 160–370)
RBC: 4.4 MIL/uL (ref 3.9–5.2)
RDW: 13.8 % (ref 11.7–14.4)
Seg Neut %: 39.6 %
WBC: 7.2 10*3/uL (ref 4.0–10.0)

## 2020-08-13 LAB — URINALYSIS WITH REFLEX TO MICROSCOPIC
Glucose,UA: NEGATIVE
Nitrite,UA: NEGATIVE
Specific Gravity,UA: 1.034 — ABNORMAL HIGH (ref 1.002–1.030)
pH,UA: 7 (ref 5.0–8.0)

## 2020-08-13 LAB — LIPID PANEL
Chol/HDL Ratio: 2.2
Cholesterol: 145 mg/dL
HDL: 65 mg/dL — ABNORMAL HIGH (ref 40–60)
LDL Calculated: 50 mg/dL
Non HDL Cholesterol: 80 mg/dL
Triglycerides: 149 mg/dL

## 2020-08-13 LAB — URINE MICROSCOPIC (IQ200): Bacteria,UA: NONE SEEN

## 2020-08-13 LAB — BASIC METABOLIC PANEL
Anion Gap: 15 (ref 7–16)
CO2: 21 mmol/L (ref 20–28)
Calcium: 9.3 mg/dL (ref 8.8–10.2)
Chloride: 102 mmol/L (ref 96–108)
Creatinine: 0.83 mg/dL (ref 0.51–0.95)
Glucose: 86 mg/dL (ref 60–99)
Lab: 9 mg/dL (ref 6–20)
Potassium: 4.2 mmol/L (ref 3.3–5.1)
Sodium: 138 mmol/L (ref 133–145)
eGFR BY CREAT: 94 *

## 2020-08-13 LAB — HEMOGLOBIN A1C: Hemoglobin A1C: 5.5 %

## 2020-11-09 ENCOUNTER — Encounter: Payer: Self-pay | Admitting: Obstetrics and Gynecology

## 2020-11-26 ENCOUNTER — Other Ambulatory Visit: Payer: Self-pay | Admitting: Primary Care

## 2021-03-06 ENCOUNTER — Other Ambulatory Visit: Payer: Self-pay | Admitting: Obstetrics and Gynecology

## 2021-03-06 DIAGNOSIS — N760 Acute vaginitis: Secondary | ICD-10-CM

## 2021-06-04 NOTE — Progress Notes (Deleted)
Darlene Villegas is a 36 y.o. female who presents for evaluation of an abnormal vaginal discharge. Symptoms have been present for {NUMBERS 0 TO 5:26892}{TIME:20383}. Vaginal symptoms: {Symptoms; vaginal:60339}.  She denies {Symptoms; vaginal:60361}.  Sexually active: *** Contraception: {Contraceptive Options:22653}. Sexually transmitted infection risk: {diagnoses; std:19578}.     No LMP recorded. ***    Patient's medications, allergies, past medical, surgical, social and family histories were reviewed and updated as appropriate.     Objective:  There were no vitals filed for this visit. ***    General appearance: 36 y.o. female. alert, cooperative and no distress  Pelvic         Ext gen:  No masses, NT         Int gen:  Negative BSU, pink, rugated with*** discharge         Cervix: Mid, pink, os closed without discharge.  Negative CMT         Uterus:  ***, NSSC, NT         Adnexa:  No masses, NT    No results found for this or any previous visit (from the past 24 hour(s)).   ***    Assessment:  ***    Plan:    1. GC, CT, and trichomoniasis sent.  2. ***  3. Reviewed S&S of COVID.  Encouraged social/physical distancing, appropriate hand washing, and use of alcohol-based gel hand sanitizers.   4. Reviewed risks/benefits of flu vaccine.  ***  5. Encouraged to schedule annual GYN exam.

## 2021-06-05 ENCOUNTER — Ambulatory Visit: Payer: Medicaid Other | Admitting: Obstetrics and Gynecology

## 2021-06-05 ENCOUNTER — Telehealth: Payer: Self-pay

## 2021-06-05 NOTE — Telephone Encounter (Signed)
Patient no showed appointment on 06/05/2021. No show letter #1 mailed to address on file.

## 2021-06-13 ENCOUNTER — Encounter: Payer: Self-pay | Admitting: Gastroenterology

## 2021-06-24 NOTE — Telephone Encounter (Signed)
Letter returned to sender, sent to scanning

## 2021-07-29 ENCOUNTER — Other Ambulatory Visit: Payer: Self-pay

## 2021-07-29 ENCOUNTER — Telehealth: Payer: Self-pay

## 2021-07-29 ENCOUNTER — Other Ambulatory Visit: Payer: Self-pay | Admitting: Obstetrics and Gynecology

## 2021-07-29 MED ORDER — LEVONORGEST-ETH ESTRAD 91-DAY 0.15-0.03 &0.01 MG PO TABS
1.0000 | ORAL_TABLET | Freq: Every day | ORAL | 3 refills | Status: DC
Start: 2021-07-29 — End: 2021-10-09

## 2021-07-29 NOTE — Telephone Encounter (Signed)
Patient returned call and has been scheduled with [facility] for next agy slot 7/13

## 2021-07-29 NOTE — Telephone Encounter (Signed)
Writer left vm X 1. If patient returns call please assist patient with scheduling an AGY appt.

## 2021-07-29 NOTE — Telephone Encounter (Signed)
Called patient per provider's request. She denies any intercourse since stopping the pills and will not resume intercourse until her OCP is re-established.

## 2021-07-29 NOTE — Telephone Encounter (Signed)
Pt is on COCPS, She moved and lost them, she has been out for 2 days and if she misses today it will be 3.  Prescriber  is not in the office so will send request to attending. Pt would also like to re-start suppressive therapy for BV. Appt offered and accepted  for 08/01/21 for the suppressive appt. OCP refill request sent to attending.

## 2021-07-30 MED ORDER — LEVONORGEST-ETH ESTRAD 91-DAY 0.15-0.03 &0.01 MG PO TABS
1.0000 | ORAL_TABLET | Freq: Every day | ORAL | 3 refills | Status: DC
Start: 2021-07-30 — End: 2022-08-14

## 2021-08-01 ENCOUNTER — Ambulatory Visit: Payer: Medicaid Other

## 2021-10-09 ENCOUNTER — Ambulatory Visit: Payer: Medicaid Other | Attending: Obstetrics and Gynecology | Admitting: Obstetrics and Gynecology

## 2021-10-09 ENCOUNTER — Encounter: Payer: Self-pay | Admitting: Obstetrics and Gynecology

## 2021-10-09 ENCOUNTER — Other Ambulatory Visit: Payer: Self-pay

## 2021-10-09 VITALS — BP 137/86 | HR 104 | Ht 63.0 in | Wt 196.0 lb

## 2021-10-09 DIAGNOSIS — Z01419 Encounter for gynecological examination (general) (routine) without abnormal findings: Secondary | ICD-10-CM | POA: Insufficient documentation

## 2021-10-09 DIAGNOSIS — N390 Urinary tract infection, site not specified: Secondary | ICD-10-CM | POA: Insufficient documentation

## 2021-10-09 DIAGNOSIS — Z1331 Encounter for screening for depression: Secondary | ICD-10-CM | POA: Insufficient documentation

## 2021-10-09 LAB — POCT 7 URINALYSIS DIPSTICK
Glucose,UA POCT: NORMAL mg/dL
Ketones,UA POCT: NEGATIVE mg/dL
Leuk Esterase,UA POCT: 1 — AB
Lot #: 64074305
Nitrite,UA POCT: NEGATIVE
PH,UA POCT: 5 (ref 5–8)

## 2021-10-09 MED ORDER — SULFAMETHOXAZOLE-TRIMETHOPRIM 800-160 MG PO TABS *I*
1.0000 | ORAL_TABLET | Freq: Two times a day (BID) | ORAL | 0 refills | Status: AC
Start: 2021-10-09 — End: 2021-10-12
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
## 2021-10-10 LAB — N. GONORRHOEAE NAAT (PCR): N. gonorrhoeae NAAT (PCR): NEGATIVE

## 2021-10-10 LAB — CHLAMYDIA NAAT (PCR): Chlamydia NAAT (PCR): NEGATIVE

## 2021-10-10 LAB — TRICHOMONAS NAAT (PCR): Trichomonas NAAT (PCR): NEGATIVE

## 2021-10-13 LAB — AEROBIC CULTURE

## 2022-02-08 ENCOUNTER — Other Ambulatory Visit: Payer: Self-pay

## 2022-02-09 ENCOUNTER — Other Ambulatory Visit
Admission: RE | Admit: 2022-02-09 | Discharge: 2022-02-09 | Disposition: A | Discharge status: Home / Self Care | Payer: Medicaid Other | Source: Ambulatory Visit

## 2022-02-13 LAB — TB AG T-CELL STIMULATION
TB AG 1: 0.06 IU/mL
TB AG 2: 0.05 IU/mL
TB AG T-Cell Stim: NEGATIVE IU/mL

## 2022-06-22 ENCOUNTER — Other Ambulatory Visit
Admission: RE | Admit: 2022-06-22 | Discharge: 2022-06-22 | Disposition: A | Discharge status: Home / Self Care | Payer: Medicaid Other | Source: Ambulatory Visit | Attending: Family Medicine | Admitting: Family Medicine

## 2022-06-22 ENCOUNTER — Other Ambulatory Visit: Payer: Self-pay

## 2022-06-22 DIAGNOSIS — Z7251 High risk heterosexual behavior: Secondary | ICD-10-CM | POA: Insufficient documentation

## 2022-06-22 DIAGNOSIS — Z0489 Encounter for examination and observation for other specified reasons: Secondary | ICD-10-CM | POA: Insufficient documentation

## 2022-06-22 DIAGNOSIS — R300 Dysuria: Secondary | ICD-10-CM | POA: Insufficient documentation

## 2022-06-22 DIAGNOSIS — N760 Acute vaginitis: Secondary | ICD-10-CM | POA: Insufficient documentation

## 2022-06-22 MED ORDER — METRONIDAZOLE 0.75 % VA GEL *I*
1.0000 | Freq: Every day | VAGINAL | 0 refills | Status: AC
Start: 2022-06-22 — End: 2022-06-27
  Filled 2022-06-22: qty 70, 5d supply, fill #0

## 2022-06-22 MED ORDER — FLUCONAZOLE 150 MG PO TABS *I*
150.0000 mg | ORAL_TABLET | ORAL | 0 refills | Status: DC
Start: 2022-06-22 — End: 2023-03-16
  Filled 2022-06-22: qty 2, 6d supply, fill #0

## 2022-06-22 MED ORDER — SULFAMETHOXAZOLE-TRIMETHOPRIM 800-160 MG PO TABS *I*
1.0000 | ORAL_TABLET | Freq: Two times a day (BID) | ORAL | 0 refills | Status: AC
Start: 2022-06-22 — End: 2022-06-27
  Filled 2022-06-22: qty 10, 5d supply, fill #0

## 2022-06-23 LAB — VAGINITIS SCREEN: DNA PROBE
Vaginitis Screen:DNA Probe: POSITIVE — AB
Vaginitis Screen:DNA Probe: POSITIVE — AB

## 2022-06-23 LAB — CHLAMYDIA NAAT (PCR): Chlamydia NAAT (PCR): NEGATIVE

## 2022-06-23 LAB — N. GONORRHOEAE NAAT (PCR): N. gonorrhoeae NAAT (PCR): NEGATIVE

## 2022-07-06 ENCOUNTER — Encounter: Payer: Self-pay | Admitting: Obstetrics and Gynecology

## 2022-07-22 ENCOUNTER — Other Ambulatory Visit: Payer: Self-pay

## 2022-08-14 ENCOUNTER — Telehealth: Payer: Self-pay

## 2022-08-14 ENCOUNTER — Other Ambulatory Visit: Payer: Self-pay | Admitting: Obstetrics and Gynecology

## 2022-08-14 NOTE — Addendum Note (Signed)
Addended by: Olin Hauser on: 08/14/2022 10:28 AM     Modules accepted: Orders

## 2022-08-14 NOTE — Telephone Encounter (Signed)
Pt returning missed call, per message below AGY has been scheduled for 11/19/22 w/Np Magnus Ivan, R. Pt looking to see if she can have enough BC to last till her upcoming appt 10/2022.

## 2022-08-14 NOTE — Telephone Encounter (Signed)
Writer contact pt LVM. When pt returns call assist with scheduling.

## 2022-08-14 NOTE — Telephone Encounter (Signed)
Pt has 3 days left of OCPS. AGY  has been scheduled for August. Refill request pended and routed to [facility].

## 2022-08-14 NOTE — Telephone Encounter (Signed)
Pt called and has been scheduled for AGY w/[facility] Louie for 11/19/22, looking to see if she can have provider send enough pills till upcoming AGY.

## 2022-11-19 ENCOUNTER — Ambulatory Visit: Payer: Medicaid Other | Admitting: Obstetrics and Gynecology

## 2022-11-19 ENCOUNTER — Telehealth: Payer: Self-pay
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
## 2022-11-19 NOTE — Telephone Encounter (Signed)
Chief Complaint: Acute Vaginitis    Subjective:      Darlene Villegas is a 37 y.o. female who called the office with concerns related to abnormal vaginal discharge and odor; patient desires STI screening as well.    Symptoms have been present for 2 weeks.     Vaginal symptoms: vaginal symptoms of perineal odor and abnormal vaginal discharge    Sexually active: yes No LMP recorded. (Menstrual status: Other medications). Contraception: OCPs. Possible pregnancy: no    Last STI screening in the last 3 months? no Any new partners since last STI screening? no Sexually transmitted infection risk: very low risk of STD exposure.     Additional Information:     Fever > 100.5'F: no  Antibiotic treatment in last 2 weeks: no  Irregular vaginal bleeding: no      Plan:     The following were reviewed with the patient:    We recommend an in person evaluation for acute vaginitis to be certain of the cause and provide the best treatment.

## 2022-11-20 ENCOUNTER — Ambulatory Visit: Payer: Medicaid Other | Admitting: Obstetrics and Gynecology

## 2022-11-23 ENCOUNTER — Ambulatory Visit: Payer: Medicaid Other

## 2022-12-08 ENCOUNTER — Other Ambulatory Visit
Admission: RE | Admit: 2022-12-08 | Discharge: 2022-12-08 | Disposition: A | Discharge status: Home / Self Care | Payer: Medicaid Other | Source: Ambulatory Visit | Attending: Family Medicine | Admitting: Family Medicine

## 2022-12-08 DIAGNOSIS — R300 Dysuria: Secondary | ICD-10-CM | POA: Insufficient documentation

## 2022-12-08 DIAGNOSIS — Z7251 High risk heterosexual behavior: Secondary | ICD-10-CM | POA: Insufficient documentation

## 2022-12-08 DIAGNOSIS — Z0489 Encounter for examination and observation for other specified reasons: Secondary | ICD-10-CM | POA: Insufficient documentation

## 2022-12-08 DIAGNOSIS — Z711 Person with feared health complaint in whom no diagnosis is made: Secondary | ICD-10-CM | POA: Insufficient documentation

## 2022-12-09 LAB — N. GONORRHOEAE NAAT (PCR): N. gonorrhoeae NAAT (PCR): NEGATIVE

## 2022-12-09 LAB — AEROBIC CULTURE: Aerobic Culture: 0

## 2022-12-09 LAB — CHLAMYDIA NAAT (PCR): Chlamydia NAAT (PCR): NEGATIVE

## 2022-12-09 LAB — TRICHOMONAS NAAT: Trichomonas NAAT: POSITIVE — AB

## 2022-12-09 LAB — VAGINITIS SCREEN: DNA PROBE: Vaginitis Screen:DNA Probe: POSITIVE — AB

## 2022-12-11 ENCOUNTER — Other Ambulatory Visit: Payer: Self-pay

## 2022-12-11 MED ORDER — METRONIDAZOLE 500 MG PO TABS *I*
ORAL_TABLET | ORAL | 0 refills | Status: DC
Start: 2022-12-11 — End: 2023-03-16
  Filled 2022-12-11: qty 4, 1d supply, fill #0
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
## 2023-02-11 ENCOUNTER — Ambulatory Visit: Payer: Medicaid Other | Admitting: Obstetrics and Gynecology

## 2023-02-11 DIAGNOSIS — Z01419 Encounter for gynecological examination (general) (routine) without abnormal findings: Secondary | ICD-10-CM

## 2023-03-16 ENCOUNTER — Other Ambulatory Visit: Payer: Self-pay

## 2023-03-16 ENCOUNTER — Ambulatory Visit: Payer: Medicaid Other | Attending: Obstetrics and Gynecology | Admitting: Obstetrics and Gynecology

## 2023-03-16 ENCOUNTER — Encounter: Payer: Self-pay | Admitting: Obstetrics and Gynecology

## 2023-03-16 ENCOUNTER — Other Ambulatory Visit
Admission: RE | Admit: 2023-03-16 | Discharge: 2023-03-16 | Disposition: A | Discharge status: Home / Self Care | Payer: Medicaid Other | Source: Ambulatory Visit

## 2023-03-16 VITALS — BP 160/84 | HR 92 | Ht 63.0 in | Wt 199.0 lb

## 2023-03-16 DIAGNOSIS — Z3041 Encounter for surveillance of contraceptive pills: Secondary | ICD-10-CM | POA: Insufficient documentation

## 2023-03-16 DIAGNOSIS — Z113 Encounter for screening for infections with a predominantly sexual mode of transmission: Secondary | ICD-10-CM | POA: Insufficient documentation

## 2023-03-16 DIAGNOSIS — N760 Acute vaginitis: Secondary | ICD-10-CM | POA: Insufficient documentation

## 2023-03-16 DIAGNOSIS — Z1331 Encounter for screening for depression: Secondary | ICD-10-CM

## 2023-03-16 DIAGNOSIS — Z01411 Encounter for gynecological examination (general) (routine) with abnormal findings: Secondary | ICD-10-CM | POA: Insufficient documentation

## 2023-03-16 DIAGNOSIS — Z30011 Encounter for initial prescription of contraceptive pills: Secondary | ICD-10-CM | POA: Insufficient documentation

## 2023-03-16 DIAGNOSIS — Z6835 Body mass index (BMI) 35.0-35.9, adult: Secondary | ICD-10-CM | POA: Insufficient documentation

## 2023-03-16 DIAGNOSIS — B9689 Other specified bacterial agents as the cause of diseases classified elsewhere: Secondary | ICD-10-CM | POA: Insufficient documentation

## 2023-03-16 DIAGNOSIS — R030 Elevated blood-pressure reading, without diagnosis of hypertension: Secondary | ICD-10-CM | POA: Insufficient documentation

## 2023-03-16 LAB — POCT VAGINAL WET MOUNT
CLUE CELLS,POC: POSITIVE
KOH FOR FUNGUS: NEGATIVE
LEUKOCYTES,POC: NEGATIVE
Nitrazine PH, POCT: 5 (ref <=4.5)
TRICHOMONAS, POC: NEGATIVE
WHIFF TEST: POSITIVE
YEAST,POC: NEGATIVE

## 2023-03-16 LAB — CHLAMYDIA NAAT (PCR): Chlamydia NAAT (PCR): NEGATIVE

## 2023-03-16 LAB — N. GONORRHOEAE NAAT (PCR): N. gonorrhoeae NAAT (PCR): NEGATIVE

## 2023-03-16 LAB — HIV-1/2  ANTIGEN/ANTIBODY SCREEN WITH CONFIRMATION: HIV 1&2 ANTIGEN/ANTIBODY: NONREACTIVE

## 2023-03-16 LAB — SYPHILIS SCREEN
Syphilis Screen: NEGATIVE
Syphilis Status: NONREACTIVE

## 2023-03-16 LAB — HEPATITIS C VIRUS ANTIBODY WITH REFLEX TO HEPATITIS C QUANTITATIVE: Hep C Ab: NEGATIVE

## 2023-03-16 LAB — TRICHOMONAS NAAT: Trichomonas NAAT: POSITIVE — AB

## 2023-03-16 LAB — HEPATITIS B SURFACE ANTIGEN: HBV S Ag: NEGATIVE

## 2023-03-16 MED ORDER — DROSPIRENONE 4 MG PO TABS *A*
4.0000 mg | ORAL_TABLET | Freq: Every day | ORAL | 4 refills | Status: AC
Start: 2023-03-16 — End: 2023-06-08

## 2023-03-16 MED ORDER — LORATADINE 10 MG PO TABS *I*
10.0000 mg | ORAL_TABLET | Freq: Every day | ORAL | 2 refills | Status: AC
Start: 2023-03-16 — End: ?

## 2023-03-16 MED ORDER — METRONIDAZOLE 0.75 % VA GEL *I*
VAGINAL | 4 refills | Status: AC
Start: 2023-03-16 — End: ?

## 2023-03-16 MED ORDER — METRONIDAZOLE 0.75 % VA GEL *I*
1.0000 | Freq: Every evening | VAGINAL | 0 refills | Status: DC
Start: 2023-03-16 — End: 2023-03-17

## 2023-03-16 NOTE — Patient Instructions (Addendum)
Breast awareness and self-exam         Beginning in their 20s, women should be told about the benefits and limitations of breast self-exam (BSE). Women should be aware of how their breasts normally look and feel and report any new breast changes to a health professional as soon as they are found. Finding a breast change does not necessarily mean there is a cancer.     A woman can notice changes by knowing how her breasts normally look and feel and feeling her breasts for changes (breast awareness), or by choosing to use a step-by-step approach and using a specific schedule to examine her breasts.   Women with breast implants can do BSE. It may be useful to have the surgeon help identify the edges of the implant so that you know what you are feeling. There is some thought that the implants push out the breast tissue and may make it easier to examine. Women who are pregnant or breast-feeding can also choose to examine their breasts regularly.    If you choose to do BSE, the following information provides a step-by-step approach for the exam. The best time for a woman to examine her breasts is when the breasts are not tender or swollen. Women who examine their breasts should have their technique reviewed during their periodic health exams by their health care professional.     It is acceptable for women to choose not to do BSE or to do BSE occasionally. Women who choose not to do BSE should still know how their breasts normally look and feel and report any changes to their doctor right away.    How to examine your breasts  Lie down on your back and place your right arm behind your head. The exam is done while lying down, not standing up. This is because when lying down the breast tissue spreads evenly over the chest wall and is as thin as possible, making it much easier to feel all the breast tissue.    Use the finger pads of the 3 middle fingers on your left hand to feel for lumps in the right breast. Use overlapping  dime-sized circular motions of the finger pads to feel the breast tissue.    Use 3 different levels of pressure to feel all the breast tissue. Light pressure is needed to feel the tissue closest to the skin; medium pressure to feel a little deeper; and firm pressure to feel the tissue closest to the chest and ribs. It is normal to feel a firm ridge in the lower curve of each breast, but, you should tell your doctor if you feel anything else out of the ordinary. If you're not sure how hard to press, talk with your doctor or nurse. Use each pressure level to feel the breast tissue before moving on to the next spot.           Move around the breast in an up and down pattern starting at an imaginary line drawn straight down your side from the underarm and moving across the breast to the middle of the chest bone (sternum or breastbone). Be sure to check the entire breast area going down until you feel only ribs and up to the neck or collar bone (clavicle).        Move around the breast in an up and down pattern starting at an imaginary line drawn straight down your side from the underarm and moving across the breast to the middle   of the chest bone (sternum or breastbone). Be sure to check the entire breast area going down until you feel only ribs and up to the neck or collar bone (clavicle).     There is some evidence to suggest that the up-and-down pattern (sometimes called the vertical pattern) is the most effective pattern for covering the entire breast without missing any breast tissue.   Repeat the exam on your left breast, putting your left arm behind your head and using the finger pads of your right hand to do the exam.     While standing in front of a mirror with your hands pressing firmly down on your hips, look at your breasts for any changes of size, shape, contour, or dimpling, or redness or scaliness of the nipple or breast skin. (The pressing down on the hips position contracts the chest wall muscles and  enhances any breast changes.)    Examine each underarm while sitting up or standing and with your arm only slightly raised so you can easily feel in this area. Raising your arm straight up tightens the tissue in this area and makes it harder to examine.     This procedure for doing breast self-exam is different from previous recommendations. These changes represent an extensive review of the medical literature and input from an expert advisory group. There is evidence that this position (lying down), the area felt, pattern of coverage of the breast, and use of different amounts of pressure increase a woman's ability to find abnormal areas.       Reviewed 12/2009  Healthy Practices for Women of all Ages     In addition to your regular OB/GYN history, physical, cholesterol and diabetes screenings, cervical cancer screen, and other recommended cancer screening tests, we ask that you review this list of “healthy practices.”  Modifying your daily activities according to these practices may improve your overall health and well-being.  In the event of questions about this list, ask your health care provider.  Additionally, there are specially written pamphlets available, which offer further information.    Diet and Exercise:  Limit fat and cholesterol; emphasize fruits, grains and vegetables.  Consume dairy products or use calcium supplementation for adequate calcium intake (1200 mg or more).  Add folic acid supplementation 0.4 mg (400 micrograms, present in most daily multivitamins) at least two months before considering pregnancy to reduce the risks of birth defects.  Participate in regular exercise for 30 minutes at least five times a week, and consider weight training.    Injury Prevention:  Seat/lap belts should be worn while in a moving car.  Helmet should be used when using motorcycles, bicycles, roller blades and ATV’s or skiing.  Place approved smoke detectors in your house and replace the batteries twice a year.     Guns and other firearms should be stored unloaded and in a locked area.  Trigger locks should be used as well.  Consider CPR training for household members.  Do not Text and Drive.    Dental Health:  Schedule regular visits to the dentist.  Floss and brush with fluoride toothpaste daily.    Immunizations:  A tetanus/diphtheria booster shot (d/T) is recommended every 10 years.  An MMR vaccine is recommended for non-pregnant women born after 1957 without proof of immunity of documentation of previous immunization.  Adults who are susceptible to varicella (chicken pox) should be vaccinated.  Influenza vaccine is indicated yearly for all patients.  Pneumococcal pneumonia vaccine is indicated   for age 65 and older once in your life.  Hepatitis A and/or B vaccines are recommended for high-risk individuals.    Substance Abuse:  Stop smoking; do not use any other tobacco products.  Avoid alcohol use when driving, boating, swimming or operating other machinery. Avoid excessive use of alcoholic beverages.  Recreational drug use (marijuana, cocaine, etc.) is dangerous and can be habit-forming.    Sexual Behavior:  Be sure to use contraception if pregnancy is not desired.  Regular use of female or female condoms with spermicide helps prevent STD’s.  Consider HIV testing if:  You have had more than one sexual partner.  You have had any STD’s.  You have used intravenous drugs.  You have a sexual partner with these (the above) risk factors.  Your sexual partner has had female homosexual exposure.  You received a blood transfusion during 1978-1985.    Breast Health:  A mammogram should be done once at age 40, then every 1-2 years based on risk factors.  If there is a strong family history of premenopausal breast cancer, you may need to start with mammograms earlier than age 40.    Colon Cancer Surveillance:  Beginning at age 45, stool occult blood screening should be done annually and/or sigmoidoscopy (scope examination of the colon)  every 3-5 years.    Women and Alcohol  For women, alcohol use carries some unique and special concerns. Most studies on alcohol have been on men, but we now know women may respond differently to alcohol.   Concerns: Liver damage, cancer, menstrual cycle changes, birth defects, fetal alcohol syndrome, intoxication, nutrition and weight management.  All women should avoid all alcohol during pregnancy.  Help for Problem Drinking:  One in every three members of Alcoholics Anonymous (AA) is now a woman.  AA meetings are held regularly and have led the way in demonstrating the effectiveness of self-help.  Outpatient and Inpatient Treatment are also available.  Ask your health care provider for a referral.  The first step in successful treatment is to recognize that there is a problem and accept help.    Dietary and Supplemental Calcium  It is important that you build and protect your bone mass through a program of regular exercise and proper nutrition with adequate amounts of calcium in your diet. Dairy products are considered to be the most important sources of calcium.

## 2023-03-16 NOTE — Progress Notes (Signed)
[facility]    Subjective:    Darlene Villegas is a 37 y.o. presents for an annual exam.     HPI: Darlene Villegas is a 37 y.o. female G2P0020 with LMP of   who presents for an annual exam.     She is currently using oral contraceptive pills for contraception. Reports regular menses Q month. Denies concerns for heavy, prolonged or intermenstrual bleeding.      She is sexually active.  She reports no problems with intercourse. She is not planning on getting pregnant in the next year. STI screening is requested.  Patient notes no breast concerns today. Other gyn related symptoms noted today:     -She reports issues with recurrent BV. She used metrogel with Tx. She was dx a few times this year and has used left over BV without office visits as well. She is sexually active with a consistent partner and no longer using condoms.   -She wonders about future pregnancy and is not trying at this time. She has history of ectopic pregnancy. She had HSG in the past and wondering if one is needed now. She is using [facility] for [facility]  -She had elevated B/P with urgent care visit. She denies headaches, vision changes, CP or SOB. She reports her PCP retired from [facility] and she can go there for care.   -She would like to have allergy medication Claritin renewed for seasonal allergies    PAST MEDICAL HISTORY:    Past Medical History:   Diagnosis Date    Abnormal Pap smear     Anemia     Impacted third molar tooth 12/16/2012    Irregular periods/menstrual cycles        PAST SURGICAL HISTORY:    Past Surgical History:   Procedure Laterality Date    ECTOPIC PREGNANCY SURGERY  2009    OVARY REMOVAL      07/2007 with ectopic    PELVIC LAPAROSCOPY      10/2007 - [facility]       PROBLEM LIST:   Patient Active Problem List   Diagnosis Code    Caries K02.9    Abnormal Pap smear of cervix R87.619    Trichomoniasis A59.9       Patient's medications, allergies, past medical, surgical, social and family histories were reviewed and updated as  appropriate.    GYN History   Sexually Active: YesPartner(s):   Female   Birth-Control/Protection: OCP  Menstrual Status: Premenopausal    Age of Menarche: 12    Menses: Irregular    Pap History: History of abnormal         Comment: 2015 and 2016    Sexually Transmitted Infection History: Trichomonas, Chlamydia    HPV Vaccine Series: s/p Full Series        OB History       Gravida   2    Para        Term        Preterm        AB   2    Living   0         SAB        IAB        Ectopic   2    Multiple        Live Births                    Screening History:   Last pap smear: Date: 10/2018  Results: NILM, Negative HPV  History of abnormal pap smear: Hx LSIL 2015  The patient wears seatbelts: yes   The patient participates in regular exercise: yes   Has the patient ever been transfused? no  Lives with self  Guns in home:no  Seat belt: yes  Helmet: yes prn     Depression (est PHQ/map) 35(?0) normal < 55 03/16/2023   GAD-7 0 normal < 10 03/16/2023   Audit 2 low risk < 8 03/16/2023       Health care proxy: form reviewed and given to patient.   Patient's medications, allergies, past medical, surgical, social and family histories were reviewed and updated as appropriate.     ROS: Constitutional, cardiovascular, respiratory, GI and GU systems negative except for HPI     Objective:    Blood pressure 160/84, pulse 92, height 1.6 m (5\' 3" ), weight 90.3 kg (199 lb).  Chaperone: Chilton Greathouse, PCT was present  Utilizing a shared decision model, a pelvic exam was performed today as indicated by screening recommendations, medical history and/or symptoms.   OB/GYN Physical Exam   General: A&O x 3.   General appearance: Well appearing women whom is well groomed and dressed and, in no apparent distress.  Maintains eye contact and answers all questions appropriately.    Skin: warm, dry, and intact without suspicious rash or lesions.  HEENT: Neck is supple, no thyromegaly or lymphadenopathy noted.    Lungs: CTA bilaterally from apex to  bases.  No wheezes, no rhonchi.  Breathing is regular and unlabored.  Heart: Normal S1 & S2, regular rate, regular rhythm.  No murmurs identified.  Breasts: Breasts symmetrical.  No dimpling, lumps, masses, or discharge noted.  No supraclavicular or axillary lymphadenopathy noted.  Abdomen:  Non-distended, soft, non tender with  palpation.  No masses or organomegaly noted.  Suprapubic area non-tender.    Pelvic: Normal hair distribution.  External genitalia within normal limits, pink, moist, no lesions noted.  Vaginal mucosa pink and moist with rugae intact.  No internal lesions noted.  Moderate, thin, white discharge.  Cervix is pink, firm, mobile, and without lesions.  No cervical motion tenderness.  Uterus midline, anteverted, firm, smooth, and non tender.  Normal shape and size.  Adnexa non-palpable bilaterally.  No adnexal tenderness noted.  Perineum smooth and intact, no lesions or masses noted.  Rectovaginal exam:No evidence of external hemorrhoids, fissures, skin tags, or trauma.   Recent Results (from the past 24 hours)   POCT vaginal wet mount    Collection Time: 03/16/23  8:40 AM   Result Value Ref Range    TRICHOMONAS, POC Negative-none seen Negative    YEAST,POC Negative-none seen Negative    YEAST COMMENT,POC      CLUE CELLS,POC Positive many Negative    LEUKOCYTES,POC Negative-none seen Negative to Few    KOH FOR FUNGUS Negative-none seen Negative    KOH FOR FUNGUS COMMENT      WHIFF TEST Positive Negative    Nitrazine PH, POCT 5.0 <=4.5    Nitrazine pH Lot Number      Nitrazine pH Expiration Date               Assessment/Plan:   Well woman exam with routine gynecological exam   Preventive health :   -Healthy Women's Lifestyle information provided  -Nutrition and exercise reviewed.    -Vitamins, Calcium and vitamin D  -Discussed breast self-awareness and written information provided  -Smoking cessation: N/A  -Sun protection with use of sun  screen reviewed.      GYN Screening:   -Pap smear: guidelines  reviewed. Offered with her family hx of cervical cancer and she declined. She will have Pap/HPV screen as planned 2025.   -Mammogram: n/a  -Colon cancer screening:  n/a  -Dexa scan: n/a     STI screening and Safer sex:   -Risk and prevention of STI reviewed.   -Comprehensive STD testing offered: HIV, syphilis, Hepatitis B&C ordered.  Ivery Quale, GC, CT offered and ordered.     Vaccinations:   -Up to date.    -HPV vaccine (Gardisil): completed series  -Covid 19 Vaccine: benefits and efficacy reviewed.   -Influenza vaccine: offered and declined.    -Tdap:      Contraception:   -Would transition off of Estrogen based products with HTN concerns.  -Contraceptive options reviewed and written information provided.  she would like to use POP.  She plans to stop [facility] and start Slyn now as she is just starting a new pack of pills. She is aware she could experience BTB or irregular bleeding with transition to new pill.   - drospirenone (SLYND) 4 MG tablet; Take 1 tablet (4 mg total) by mouth daily.  Dispense: 84 tablet; Refill: 4  -Reviewed pregnancy would be high risk with AMA status, HTN concern and initial concerns for hx of ectopic. She is not planning pregnancy.     BMI 35.0-35.9,adult  -Health risks and weight management strategies reviewed.     BV (bacterial vaginosis)  -Vaginitis prevention and vulvar care reviewed. Rx for Metrogel sent.   - metroNIDAZOLE (METROGEL-VAGINAL) 0.75 % vaginal gel; Place 1 applicatorful vaginally nightly for 5 days for Vaginosis caused by Bacteria.  Dispense: 70 g; Refill: 0    Recurrent vaginitis: BV  Reviewed suppressive therapy options and she desires to trial use of Metrogel twice weekly for up to 6 months.   - metroNIDAZOLE (METROGEL-VAGINAL) 0.75 % vaginal gel; for Vaginosis caused by Bacteria. One applicator of gel in the vagina twice weekly for up to 6 months  Start one week after you complete 5 night tx for BV  Dispense: 140 g; Refill: 4    Elevated blood pressure reading?  HTN  -Reviewed HTN concerns and written information provided.  -Encouraged to follow up with PCP regarding review and management plan.   -Will change contraception to Progesterone product and eliminate estrogen.   -Seek Urgent care with CP, SOB, DOE persistent headaches.       Patient able to teach back information given to her today.    RTC 2 months for pill check   RTC 1 year for AGy or as needed or for next annual exam.

## 2023-03-17 ENCOUNTER — Telehealth: Payer: Self-pay | Admitting: Obstetrics and Gynecology

## 2023-03-17 DIAGNOSIS — A599 Trichomoniasis, unspecified: Secondary | ICD-10-CM

## 2023-03-17 MED ORDER — METRONIDAZOLE 500 MG PO TABS *I*
500.0000 mg | ORAL_TABLET | Freq: Two times a day (BID) | ORAL | 0 refills | Status: DC
Start: 2023-03-17 — End: 2023-03-17

## 2023-03-17 MED ORDER — METRONIDAZOLE 500 MG PO TABS *I*
2000.0000 mg | ORAL_TABLET | Freq: Once | ORAL | 0 refills | Status: AC
Start: 2023-03-17 — End: 2023-03-17

## 2023-03-17 NOTE — Addendum Note (Signed)
Addended by: Gloriann Loan on: 03/17/2023 12:21 PM     Modules accepted: Orders

## 2023-03-17 NOTE — Telephone Encounter (Signed)
Pt aware that it is ok for her to take 4 tablets of flagyl and to avoid ETOH, Pt made TOC appt  in 4 weeks with Fanny Bien, NP

## 2023-03-17 NOTE — Telephone Encounter (Signed)
Pt returning call from earlier. +trich results reviewed with pt.    Pt is asking if she can have the treatment ordered to take 4 pills at once instead of the BID for 7 days as she has had difficulty tolerating flagyl in the past.     Will forward request to provider and return call to pt to provide providers response.

## 2023-03-17 NOTE — Telephone Encounter (Signed)
+   trich. Rx for oral flagyl to take bid x 7 days went to pharmacy on file. She was dx with BV and the 7 days of oral flagyl will treat the BV as well. She dose not need to use the 5 night of Metrogel for BV treatment.   She can start the Metrogel twice a week for BV suppression 1 week after she completes the oral pills.    STI and partner will need Tx. She should avoid sex for 1 week and then use condoms for 1 week after she and her partner are treated.  She should return in 5 weeks for repeat cx to make sure the infection is gone.     Office will notify patient.

## 2023-03-17 NOTE — Telephone Encounter (Signed)
Patient can use the 1 x dose as an alternative. It will be important for her to come back for the early TOC  in 4 weeks as women tend to need the medication for a longer time. Office will notify patient.

## 2023-03-17 NOTE — Telephone Encounter (Signed)
Left message to call the office back at [phone] option [phone]

## 2023-04-14 ENCOUNTER — Ambulatory Visit: Payer: Medicaid Other | Admitting: Obstetrics and Gynecology

## 2023-04-26 ENCOUNTER — Other Ambulatory Visit: Payer: Self-pay | Admitting: Obstetrics and Gynecology

## 2023-04-26 DIAGNOSIS — N760 Acute vaginitis: Secondary | ICD-10-CM

## 2023-05-19 NOTE — Progress Notes (Deleted)
 Subjective:      Zuleyka Kloc is a 38 y.o. G52P0020 female who presents for contraception counseling. She was switched from C-OCPs to POPs in December due to HTN and presents for a pil check.    Review of Systems  {ros; complete:30496}     Objective:      There were no vitals taken for this visit. ***  General appearance: {general exam:16600}     Assessment:      38 y.o., {starting/continuing/discont:14600} {contraceptive method:5051}, {contraindications/ no contra:14601}.     Plan:      {gyn plan:13146}

## 2023-05-20 ENCOUNTER — Ambulatory Visit: Payer: Medicaid Other | Admitting: Obstetrics and Gynecology

## 2023-05-20 DIAGNOSIS — Z3041 Encounter for surveillance of contraceptive pills: Secondary | ICD-10-CM

## 2023-07-13 DIAGNOSIS — Y9389 Activity, other specified: Secondary | ICD-10-CM | POA: Insufficient documentation

## 2023-07-13 DIAGNOSIS — S51832A Puncture wound without foreign body of left forearm, initial encounter: Secondary | ICD-10-CM

## 2023-07-13 DIAGNOSIS — Z23 Encounter for immunization: Secondary | ICD-10-CM | POA: Insufficient documentation

## 2023-07-13 DIAGNOSIS — Y92007 Garden or yard of unspecified non-institutional (private) residence as the place of occurrence of the external cause: Secondary | ICD-10-CM | POA: Insufficient documentation

## 2023-07-13 DIAGNOSIS — W540XXA Bitten by dog, initial encounter: Secondary | ICD-10-CM | POA: Insufficient documentation

## 2023-07-13 DIAGNOSIS — Y998 Other external cause status: Secondary | ICD-10-CM | POA: Insufficient documentation

## 2023-07-13 DIAGNOSIS — S51852A Open bite of left forearm, initial encounter: Secondary | ICD-10-CM | POA: Insufficient documentation

## 2023-07-13 NOTE — ED Triage Notes (Signed)
 Per patient bit by a dog prior to coming to ED. Did not know the dog. Unknown last tetanus . 3 puncture wounds to left forearm     Prehospital medications given: No

## 2023-07-14 ENCOUNTER — Encounter: Payer: Self-pay | Admitting: Emergency Medicine

## 2023-07-14 ENCOUNTER — Other Ambulatory Visit: Payer: Self-pay

## 2023-07-14 ENCOUNTER — Emergency Department
Admission: EM | Admit: 2023-07-14 | Discharge: 2023-07-14 | Disposition: A | Discharge status: Home / Self Care | Source: Ambulatory Visit | Attending: Emergency Medicine | Admitting: Emergency Medicine

## 2023-07-14 DIAGNOSIS — W540XXA Bitten by dog, initial encounter: Secondary | ICD-10-CM

## 2023-07-14 MED ORDER — AMOXICILLIN-POT CLAVULANATE 875-125 MG PO TABS *I*
1.0000 | ORAL_TABLET | Freq: Two times a day (BID) | ORAL | 0 refills | Status: AC
Start: 2023-07-14 — End: 2023-07-17

## 2023-07-14 MED ORDER — TETANUS-DIPHTH-ACELL PERT 5-2.5-18.5 LF-MCG/0.5 IM SUSP *WRAPPED*
0.5000 mL | Freq: Once | INTRAMUSCULAR | Status: AC
Start: 2023-07-14 — End: 2023-07-14
  Administered 2023-07-14: 0.5 mL via INTRAMUSCULAR
  Filled 2023-07-14: qty 0.5

## 2023-07-14 MED ORDER — AMOXICILLIN-POT CLAVULANATE 875-125 MG PO TABS *I*
1.0000 | ORAL_TABLET | Freq: Once | ORAL | Status: AC
Start: 2023-07-14 — End: 2023-07-14
  Administered 2023-07-14: 1 via ORAL
  Filled 2023-07-14: qty 1

## 2023-07-14 NOTE — Discharge Instructions (Signed)
 Take augmentin as prescribed to prevent infection from the dog bite.     Return to the ED if you develop worsening swelling with redness, fever or notice green/yellow discharge from the wound.

## 2023-07-14 NOTE — ED Provider Notes (Signed)
 History     Chief Complaint   Patient presents with    Animal Bite     38 year old female presents to the ED for evaluation of a dog bite.    Patient states she went to take her dog out her this morning and to put out her trash.  She reports that another dog approached and attacked her and bit her left forearm.  She is not sure who his dog it was.  She is unsure of her last tetanus.    Denies numbness      History provided by:  Patient  Language interpreter used: No          Medical/Surgical/Family History     Past Medical History:   Diagnosis Date    Abnormal Pap smear     Anemia     Impacted third molar tooth 12/16/2012    Irregular periods/menstrual cycles         Patient Active Problem List   Diagnosis Code    Caries K02.9    Abnormal Pap smear of cervix R87.619    Trichomoniasis A59.9            Past Surgical History:   Procedure Laterality Date    ECTOPIC PREGNANCY SURGERY  2009    OVARY REMOVAL      07/2007 with ectopic    PELVIC LAPAROSCOPY      10/2007 - [facility]          Social History[1]          Review of Systems    Physical Exam     Triage Vitals  Triage Start: Start, (07/13/23 2328)  First Recorded BP: (!) 181/95, Resp: 16, Temp: 36 C (96.8 F), Temp src: Infrared Oxygen Therapy SpO2: 98 %, Oximetry Source: Rt Hand, O2 Device: None (Room air), Heart Rate: 94, (07/13/23 2330) Heart Rate (via Pulse Ox): 88, (07/14/23 0420).  First Pain Reported  0-10 Scale: 5, Pain Location/Orientation: Arm Left, (07/13/23 2330)       Physical Exam  Vitals and nursing note reviewed.   Constitutional:       Appearance: Normal appearance. She is normal weight.   HENT:      Head: Normocephalic and atraumatic.      Right Ear: External ear normal.      Left Ear: External ear normal.      Nose: Nose normal.      Mouth/Throat:      Mouth: Mucous membranes are moist.   Eyes:      Extraocular Movements: Extraocular movements intact.      Conjunctiva/sclera: Conjunctivae normal.   Cardiovascular:      Rate and Rhythm: Normal rate.    Pulmonary:      Effort: Pulmonary effort is normal.   Abdominal:      General: Abdomen is flat.   Musculoskeletal:         General: Normal range of motion.      Cervical back: Normal range of motion.   Skin:     General: Skin is warm and dry.      Comments: 3 small puncture type bite wounds overlying the left forearm.  These are superficial in appearance.   Neurological:      General: No focal deficit present.      Mental Status: She is alert.         Medical Decision Making   Patient seen by me on:  07/13/2023    Assessment:  38 year old female comes ED  after being bit by a dog earlier this evening.    Differential diagnosis:  Dog bite, strain, contusion    Plan:  No orders of the defined types were placed in this encounter.    Medications  Tdap (BOOSTRIX): tetanus, diphtheria, and accellar pertussis injection 0.5 mL (0.5 mLs Intramuscular Given 07/14/23 0108)  amoxicillin-clavulanate (AUGMENTIN) 875-125 mg per tablet 1 tablet (1 tablet Oral Given 07/14/23 0342)      ED Course and Disposition:  Wounds were cleansed and bandaged.  Patient filled out dogbite form.  Patient discharged home with prescription for Augmentin.  Wound care discussed.           Drucie [place], PA              [1]   Social History  Tobacco Use    Smoking status: Former     Packs/day: 0.30     Years: 8.00     Additional pack years: 0.00     Total pack years: 2.40     Types: Cigarettes     Quit date: 07/28/2012     Years since quitting: 10.9    Smokeless tobacco: Never   Substance Use Topics    Alcohol use: Yes     Alcohol/week: 3.0 standard drinks of alcohol     Types: 3 Cans of beer per week     Comment: socially    Drug use: No        Drucie [place], [place]  07/14/23 [phone]

## 2024-01-07 ENCOUNTER — Encounter: Payer: Self-pay | Admitting: Internal Medicine

## 2024-01-15 ENCOUNTER — Emergency Department
Admission: EM | Admit: 2024-01-15 | Discharge: 2024-01-15 | Disposition: A | Discharge status: Home / Self Care | Payer: Self-pay | Source: Ambulatory Visit | Attending: Student in an Organized Health Care Education/Training Program | Admitting: Student in an Organized Health Care Education/Training Program

## 2024-01-15 ENCOUNTER — Emergency Department: Payer: Self-pay

## 2024-01-15 ENCOUNTER — Other Ambulatory Visit: Payer: Self-pay

## 2024-01-15 DIAGNOSIS — M7989 Other specified soft tissue disorders: Secondary | ICD-10-CM

## 2024-01-15 DIAGNOSIS — M79604 Pain in right leg: Secondary | ICD-10-CM | POA: Insufficient documentation

## 2024-01-15 DIAGNOSIS — Z87891 Personal history of nicotine dependence: Secondary | ICD-10-CM | POA: Insufficient documentation

## 2024-01-15 MED ORDER — KETOROLAC TROMETHAMINE 30 MG/ML IJ SOLN *I*
15.0000 mg | Freq: Once | INTRAMUSCULAR | Status: AC
Start: 2024-01-15 — End: 2024-01-15
  Administered 2024-01-15: 15 mg via INTRAMUSCULAR
  Filled 2024-01-15: qty 1

## 2024-01-15 MED ORDER — ACETAMINOPHEN 500 MG PO TABS *I*
1000.0000 mg | ORAL_TABLET | Freq: Once | ORAL | Status: AC
Start: 2024-01-15 — End: 2024-01-15
  Administered 2024-01-15: 1000 mg via ORAL
  Filled 2024-01-15: qty 2

## 2024-01-15 NOTE — ED Triage Notes (Signed)
 Pt reported pain on the R leg >4 months; no recent injury; pt states that it's been going on for months but she ignore it but today she cannot walk and the swelling/pain is unbearable.

## 2024-01-15 NOTE — ED Provider Notes (Signed)
 History Chief Complaint Patient presents with  Leg Pain Patient is a 38 year old female presenting to the emergency department for right leg pain.  She states that it has been painful for greater than 4 months.  She tells me that she was wearing heels last night and did tripped going up the stairs.  Denies head strike or LOC.  Pain is mostly in the knee but radiates down to the ankle.  She denies chest pain or shortness of breath.  No fevers.  No numbness.History provided by:  Patient and medical recordsLanguage interpreter used: No  Medical/Surgical/Family History Past Medical History[1] Patient Active Problem List Diagnosis Code  Caries K02.9  Abnormal Pap smear of cervix R87.619  Trichomoniasis A59.9  Past Surgical History[2] Social History[3]  Review of Systems Musculoskeletal:  Positive for arthralgias. Neurological:  Negative for numbness. All other systems reviewed and are negative.Physical Exam Triage Vitals   First Recorded BP: (!) 136/94, Resp: 19, Temp: 36.9 C (98.4 F), Temp src: TEMPORAL Oxygen Therapy SpO2: 100 %, Oximetry Source: Lt Hand, O2 Device: None (Room air), Heart Rate: 99, (01/15/24 1128)  .First Pain Reported 0-10  Pain Scale: 10, Pain Location/Orientation: Leg Right, Pain Descriptors: Constant;Stabbing;Throbbing;Spasm, (01/15/24 1130) Physical ExamVitals reviewed. Constitutional:     General: She is not in acute distress.   Appearance: Normal appearance. She is normal weight. She is not ill-appearing, toxic-appearing or diaphoretic. Cardiovascular:    Rate and Rhythm: Normal rate and regular rhythm.    Pulses: Normal pulses.         Dorsalis pedis pulses are 2+ on the right side.    Heart sounds: Normal heart sounds. Pulmonary:    Effort: Pulmonary effort is normal.    Breath sounds: Normal breath sounds. Musculoskeletal:    Cervical back: Normal range of motion and  neck supple.    Comments: No focal tenderness to palpation of the right lower extremity.  No edema.  Compartments are soft.  Patient with full active range of motion at the right knee and ankle.  Able to straight leg raise.  Sensation symmetric to light touch.  DP pulses are 2+. Neurological:    Mental Status: She is alert. Medical Decision Making Patient seen by me on:  10/18/2025Assessment:  Patient is a 38 year old female presenting to the emergency department for right leg pain.  She is well-appearing resting comfortably in the chair.  Patient is normotensive and afebrile.  Heart with normal rate and rhythm.  Lungs are CTA bilaterally.  Patient reporting pain throughout the right lower extremity from the knee down.  There is no obvious edema.  No neurovascular compromise.  Full range of motion at the right knee, able to straight leg raise.Differential diagnosis:  Muscle spasmStrain/sprainContusion/hematomaLow suspicion fracture/dislocation given reassuring range of motionDVTNot consistent with acute limb ischemiaNot consistent with compartment syndromePlan:  Orders Placed This Encounter    US  doppler vein RIGHT lower extremityMedicationsketorolac (TORADOL ) 30 mg/mL injection 15 mg (15 mg Intramuscular Given 01/15/24 1357)acetaminophen  (TYLENOL ) tablet 1,000 mg (1,000 mg Oral Given 01/15/24 1357)Independent interpretation of imaging: Ultrasound Doppler of the right lower extremity negative for DVTED Course and Disposition:  Patient treated with Toradol  and Tylenol , reports improvement.  Ultrasound reassuring against DVT.  She is requesting discharge.  She was able to ambulate a short distance supported by her mother.  I advised Tylenol , Advil  and elevation.  Close PCP follow-up.  Return precautions given. Asberry Ion, PA  [1] Past Medical History:Diagnosis Date  Abnormal Pap smear   Anemia   Impacted  third molar tooth  12/16/2012  Irregular periods/menstrual cycles  [2] Past Surgical History:Procedure Laterality Date  ECTOPIC PREGNANCY SURGERY  2009  OVARY REMOVAL    07/2007 with ectopic  PELVIC LAPAROSCOPY    10/2007 - [facility] [3] Social HistoryTobacco Use  Smoking status: Former   Current packs/day: 0.00   Average packs/day: 0.3 packs/day for 8.5 years (2.5 ttl pk-yrs)   Types: Cigarettes   Start date: 07/28/2004   Quit date: 01/20/2013   Years since quitting: 10.9  Smokeless tobacco: Never  Tobacco comments:   The data in the grid above may be an average based on historical data and used for Lung Cancer Screening Eligibility.  If you find it inaccurate you can delete it and take a more accurate history. Substance Use Topics  Alcohol use: Yes   Alcohol/week: 3.0 standard drinks of alcohol   Types: 3 Cans of beer per week   Comment: socially  Drug use: No  Alric Stabs, PA10/18/25 1504

## 2024-01-15 NOTE — ED Notes (Signed)
Pt of the floor to US

## 2024-01-15 NOTE — Discharge Instructions (Signed)
 You were seen today for right leg pain.  You had an ultrasound which was negative for a blood clot.Use acetaminophen  (Tylenol ) and ibuprofen  (Advil ) every 4 hours alternating to improve pain and swelling.  No more than 2400 mg of ibuprofen  daily or 3000 mg of Tylenol  daily due to detrimental renal and liver effects.For ex. take 1000 mg of acetaminophen  at 0800, followed by 400 mg of ibuprofen  at 1200 noon, followed by 1000 mg of acetaminophen  at 4pm, followed by 400 mg of ibuprofen  at 8pm, etc. Keep your leg elevated as much as possible to reduce swelling.If you experience any worsening leg pain, swelling, chest pain, shortness of breath, fevers or any other symptoms that are concerning to you do not hesitate to return to the emergency department.

## 2024-03-31 ENCOUNTER — Other Ambulatory Visit
Admission: RE | Admit: 2024-03-31 | Discharge: 2024-03-31 | Disposition: A | Discharge status: Home / Self Care | Source: Ambulatory Visit | Attending: Internal Medicine | Admitting: Internal Medicine

## 2024-03-31 DIAGNOSIS — Z201 Contact with and (suspected) exposure to tuberculosis: Secondary | ICD-10-CM | POA: Insufficient documentation

## 2024-04-03 LAB — TB AG T-CELL STIMULATION
TB AG 1: 0 [IU]/mL
TB AG 2: 0 [IU]/mL
TB AG T-Cell Stim: NEGATIVE [IU]/mL
